# Patient Record
Sex: Female | Born: 1975 | Race: White | Hispanic: Yes | Smoking: Never smoker
Health system: Southern US, Community
[De-identification: ages and names within clinical notes are randomized; demographics above are authoritative.]

## PROBLEM LIST (undated history)

## (undated) DIAGNOSIS — G43909 Migraine, unspecified, not intractable, without status migrainosus: Secondary | ICD-10-CM

## (undated) DIAGNOSIS — H539 Unspecified visual disturbance: Secondary | ICD-10-CM

## (undated) DIAGNOSIS — T7840XA Allergy, unspecified, initial encounter: Secondary | ICD-10-CM

## (undated) DIAGNOSIS — M549 Dorsalgia, unspecified: Secondary | ICD-10-CM

## (undated) DIAGNOSIS — F32A Depression, unspecified: Secondary | ICD-10-CM

## (undated) HISTORY — PX: TUBAL LIGATION: SHX77

## (undated) HISTORY — DX: Depression, unspecified: F32.A

## (undated) HISTORY — DX: Allergy, unspecified, initial encounter: T78.40XA

## (undated) HISTORY — DX: Unspecified visual disturbance: H53.9

## (undated) HISTORY — DX: Migraine, unspecified, not intractable, without status migrainosus: G43.909

---

## 2003-08-06 ENCOUNTER — Encounter: Admission: RE | Admit: 2003-08-06 | Discharge: 2003-08-06 | Payer: Self-pay | Admitting: Obstetrics and Gynecology

## 2004-04-01 ENCOUNTER — Ambulatory Visit (HOSPITAL_COMMUNITY): Admission: RE | Admit: 2004-04-01 | Discharge: 2004-04-01 | Payer: Self-pay | Admitting: *Deleted

## 2004-04-05 ENCOUNTER — Ambulatory Visit: Payer: Self-pay | Admitting: *Deleted

## 2004-04-05 ENCOUNTER — Ambulatory Visit (HOSPITAL_COMMUNITY): Admission: RE | Admit: 2004-04-05 | Discharge: 2004-04-05 | Payer: Self-pay | Admitting: *Deleted

## 2004-06-03 ENCOUNTER — Ambulatory Visit (HOSPITAL_COMMUNITY): Admission: RE | Admit: 2004-06-03 | Discharge: 2004-06-03 | Payer: Self-pay | Admitting: *Deleted

## 2004-07-04 ENCOUNTER — Inpatient Hospital Stay (HOSPITAL_COMMUNITY): Admission: AD | Admit: 2004-07-04 | Discharge: 2004-07-04 | Payer: Self-pay | Admitting: *Deleted

## 2004-07-04 ENCOUNTER — Ambulatory Visit: Payer: Self-pay | Admitting: Obstetrics & Gynecology

## 2004-07-04 ENCOUNTER — Ambulatory Visit: Payer: Self-pay | Admitting: Family Medicine

## 2004-07-11 ENCOUNTER — Ambulatory Visit (HOSPITAL_COMMUNITY): Admission: RE | Admit: 2004-07-11 | Discharge: 2004-07-11 | Payer: Self-pay | Admitting: *Deleted

## 2004-08-19 ENCOUNTER — Inpatient Hospital Stay (HOSPITAL_COMMUNITY): Admission: RE | Admit: 2004-08-19 | Discharge: 2004-08-22 | Payer: Self-pay | Admitting: *Deleted

## 2004-08-19 ENCOUNTER — Ambulatory Visit: Payer: Self-pay | Admitting: *Deleted

## 2004-08-24 ENCOUNTER — Inpatient Hospital Stay (HOSPITAL_COMMUNITY): Admission: AD | Admit: 2004-08-24 | Discharge: 2004-08-24 | Payer: Self-pay | Admitting: Obstetrics and Gynecology

## 2006-05-11 ENCOUNTER — Inpatient Hospital Stay (HOSPITAL_COMMUNITY): Admission: AD | Admit: 2006-05-11 | Discharge: 2006-05-12 | Payer: Self-pay | Admitting: Gynecology

## 2007-01-07 LAB — CONVERTED CEMR LAB
Pap Smear: NORMAL
Pap Smear: NORMAL

## 2008-01-06 ENCOUNTER — Ambulatory Visit: Payer: Self-pay | Admitting: Family Medicine

## 2008-01-06 DIAGNOSIS — R51 Headache: Secondary | ICD-10-CM

## 2008-01-06 DIAGNOSIS — E663 Overweight: Secondary | ICD-10-CM | POA: Insufficient documentation

## 2008-01-06 DIAGNOSIS — F329 Major depressive disorder, single episode, unspecified: Secondary | ICD-10-CM | POA: Insufficient documentation

## 2008-01-06 DIAGNOSIS — R519 Headache, unspecified: Secondary | ICD-10-CM | POA: Insufficient documentation

## 2008-01-06 DIAGNOSIS — M549 Dorsalgia, unspecified: Secondary | ICD-10-CM | POA: Insufficient documentation

## 2008-01-06 LAB — CONVERTED CEMR LAB
Bilirubin Urine: NEGATIVE
Blood in Urine, dipstick: NEGATIVE
Chlamydia, DNA Probe: NEGATIVE
GC Probe Amp, Genital: NEGATIVE
Glucose, Urine, Semiquant: NEGATIVE
Ketones, urine, test strip: NEGATIVE
Nitrite: NEGATIVE
Protein, U semiquant: NEGATIVE
Specific Gravity, Urine: 1.015
Urobilinogen, UA: 0.2
WBC Urine, dipstick: NEGATIVE
pH: 7

## 2008-01-07 ENCOUNTER — Encounter: Payer: Self-pay | Admitting: Family Medicine

## 2008-02-17 ENCOUNTER — Emergency Department (HOSPITAL_COMMUNITY): Admission: EM | Admit: 2008-02-17 | Discharge: 2008-02-17 | Payer: Self-pay | Admitting: Family Medicine

## 2008-08-03 ENCOUNTER — Ambulatory Visit: Payer: Self-pay | Admitting: Family Medicine

## 2008-08-03 DIAGNOSIS — R002 Palpitations: Secondary | ICD-10-CM | POA: Insufficient documentation

## 2008-08-06 ENCOUNTER — Ambulatory Visit: Payer: Self-pay | Admitting: Family Medicine

## 2008-08-06 ENCOUNTER — Encounter: Payer: Self-pay | Admitting: Family Medicine

## 2008-08-06 LAB — CONVERTED CEMR LAB
ALT: 10 units/L (ref 0–35)
AST: 14 units/L (ref 0–37)
Albumin: 4.4 g/dL (ref 3.5–5.2)
Alkaline Phosphatase: 58 units/L (ref 39–117)
BUN: 14 mg/dL (ref 6–23)
Basophils Absolute: 0 10*3/uL (ref 0.0–0.1)
Basophils Relative: 1 % (ref 0–1)
CO2: 25 meq/L (ref 19–32)
Calcium: 8.8 mg/dL (ref 8.4–10.5)
Chloride: 104 meq/L (ref 96–112)
Cholesterol: 185 mg/dL (ref 0–200)
Creatinine, Ser: 0.57 mg/dL (ref 0.40–1.20)
Eosinophils Absolute: 0.1 10*3/uL (ref 0.0–0.7)
Eosinophils Relative: 2 % (ref 0–5)
Glucose, Bld: 89 mg/dL (ref 70–99)
HCT: 42.4 % (ref 36.0–46.0)
HDL: 54 mg/dL (ref >39)
Hemoglobin: 13.9 g/dL (ref 12.0–15.0)
LDL Cholesterol: 119 mg/dL — ABNORMAL HIGH (ref 0–99)
Lymphocytes Relative: 26 % (ref 12–46)
Lymphs Abs: 1.1 10*3/uL (ref 0.7–4.0)
MCHC: 32.8 g/dL (ref 30.0–36.0)
MCV: 88.7 fL (ref 78.0–100.0)
Monocytes Absolute: 0.2 10*3/uL (ref 0.1–1.0)
Monocytes Relative: 5 % (ref 3–12)
Neutro Abs: 2.8 10*3/uL (ref 1.7–7.7)
Neutrophils Relative %: 66 % (ref 43–77)
Platelets: 215 10*3/uL (ref 150–400)
Potassium: 4.4 meq/L (ref 3.5–5.3)
RBC: 4.78 M/uL (ref 3.87–5.11)
RDW: 12.9 % (ref 11.5–15.5)
Sodium: 140 meq/L (ref 135–145)
TSH: 1.401 microintl units/mL (ref 0.350–4.50)
Total Bilirubin: 0.4 mg/dL (ref 0.3–1.2)
Total CHOL/HDL Ratio: 3.4
Total Protein: 7.3 g/dL (ref 6.0–8.3)
Triglycerides: 59 mg/dL (ref <150)
VLDL: 12 mg/dL (ref 0–40)
WBC: 4.3 10*3/uL (ref 4.0–10.5)

## 2008-08-12 ENCOUNTER — Encounter: Payer: Self-pay | Admitting: Family Medicine

## 2008-10-16 ENCOUNTER — Telehealth: Payer: Self-pay | Admitting: Family Medicine

## 2008-10-16 ENCOUNTER — Encounter (INDEPENDENT_AMBULATORY_CARE_PROVIDER_SITE_OTHER): Payer: Self-pay | Admitting: Family Medicine

## 2008-10-16 ENCOUNTER — Ambulatory Visit: Payer: Self-pay | Admitting: Family Medicine

## 2008-10-16 LAB — CONVERTED CEMR LAB
ALT: 14 units/L (ref 0–35)
AST: 21 units/L (ref 0–37)
Albumin: 4.1 g/dL (ref 3.5–5.2)
Alkaline Phosphatase: 59 units/L (ref 39–117)
BUN: 11 mg/dL (ref 6–23)
Beta hcg, urine, semiquantitative: NEGATIVE
Bilirubin Urine: NEGATIVE
Blood in Urine, dipstick: NEGATIVE
CO2: 27 meq/L (ref 19–32)
Calcium: 9.1 mg/dL (ref 8.4–10.5)
Chlamydia, Swab/Urine, PCR: NEGATIVE
Chloride: 105 meq/L (ref 96–112)
Creatinine, Ser: 0.59 mg/dL (ref 0.40–1.20)
GC Probe Amp, Urine: NEGATIVE
Glucose, Bld: 82 mg/dL (ref 70–99)
Glucose, Urine, Semiquant: NEGATIVE
HCT: 39.3 % (ref 36.0–46.0)
Hemoglobin: 13.6 g/dL (ref 12.0–15.0)
Lipase: 32 units/L (ref 11–59)
MCHC: 34.7 g/dL (ref 30.0–36.0)
MCV: 85.2 fL (ref 78.0–100.0)
Nitrite: NEGATIVE
Platelets: 233 10*3/uL (ref 150–400)
Potassium: 4.2 meq/L (ref 3.5–5.3)
RBC: 4.61 M/uL (ref 3.87–5.11)
RDW: 13.4 % (ref 11.5–15.5)
Sodium: 139 meq/L (ref 135–145)
Specific Gravity, Urine: 1.025
Total Bilirubin: 0.9 mg/dL (ref 0.3–1.2)
Total Protein: 6.9 g/dL (ref 6.0–8.3)
Urobilinogen, UA: 0.2
WBC Urine, dipstick: NEGATIVE
WBC: 8.9 10*3/uL (ref 4.0–10.5)
pH: 6

## 2009-06-09 ENCOUNTER — Ambulatory Visit: Payer: Self-pay | Admitting: Family Medicine

## 2009-06-09 ENCOUNTER — Encounter: Payer: Self-pay | Admitting: Sports Medicine

## 2009-06-09 LAB — CONVERTED CEMR LAB
Bilirubin Urine: NEGATIVE
Glucose, Urine, Semiquant: NEGATIVE
Ketones, urine, test strip: NEGATIVE
Nitrite: NEGATIVE
Protein, U semiquant: 30
Specific Gravity, Urine: 1.025
Urobilinogen, UA: 0.2
pH: 6

## 2009-11-19 ENCOUNTER — Ambulatory Visit: Payer: Self-pay | Admitting: Family Medicine

## 2009-11-19 ENCOUNTER — Encounter: Payer: Self-pay | Admitting: Family Medicine

## 2010-08-02 NOTE — Assessment & Plan Note (Signed)
Summary: crying a lot/[place]/mayans   Vital Signs:  Patient profile:   34 year old female Weight:      152.9 pounds Temp:     98.7 degrees F oral Pulse rate:   78 / minute BP sitting:   118 / 78  (left arm) Cuff size:   regular  Vitals Entered By: Loralee Pacas CMA (Nov 19, 2009 3:22 PM) CC: depression   Primary Care Provider:  Ardeen Garland  MD  CC:  depression.  History of Present Illness: depression: for at least a year has noted: -she will cry very easily, she has felt down and at times will snap at others just so they will leave her alone. - sleep trouble - having trouble getting to sleep, having aches and pains in neck at times when trying to relax and rest - not doing things she is interested in - somedays she staes that she wants no outside contact or to do even anything when in the past she has been a very active and engaging person.  has also lost interest in having intercourse with her husband.  (though she told my nurse she was interested in more children, husband is not and she has had a tubal ligation 3 yrs ago which may have been the start of her depression).   she does note she feels safe in her home even though her husband is a drinker. - she has felt guilty about how much she hated her job and then quitting.  she is currently unemployed and uninsured - she has had very low energy - some days she doesn't even want to get out of the bed. - she has had trouble concentrating - she has had a poor appetite with weight loss.  -she denies psychomotor agitation and denies suicidial or homocidal ideation.  she does state she is alwyas looking for the bad in things and not the good for the past year and that she has had problems like this in the past.  additionally her sister has struggled some with depression.  translation provided by [employer] (spanish interpreter)  Habits & Providers  Alcohol-Tobacco-Diet     Tobacco Status: current     Tobacco Counseling: to quit  use of tobacco products     Cigarette Packs/Day: <0.25  Current Medications (verified): 1)  Citalopram Hydrobromide 40 Mg Tabs (Citalopram Hydrobromide) .... Take 1/2 Tablet By Mouth Daily For 1 Week Then 1 Tab By Mouth Daily - Spanish Instructions Please  Allergies (verified): 1)  ! Amoxicillin (Amoxicillin)  Family History: Family History Diabetes 1st degree relative - M, sister Family History Hypertension - M  Family History of Endometrial cancer? in [facility] Liver cancer MGF Family History of Skin cancer PGM   sister with depression  Social History: Married,, 4 children, 6 -18 years of age Native of [place], [place], in [place] 6 years Education: 7 years, can read Spanish well Husband Corporate investment banker, no health insurance  Faith: Catholic Alcohol use-yes, rare Drug use-no Husband does drink but Jaquisha state she feels safe in her home. Smoking Status:  current Packs/Day:  <0.25  Review of Systems       per HPI  Physical Exam  General:  Well-developed,well-nourished,in no acute distress; alert,appropriate and cooperative throughout examination.  VS noted.  depressed appearing.  poor eye contact   Impression & Recommendations:  Problem # 1:  DEPRESSION (ICD-311) Assessment Deteriorated  Penni is describing classic depression symptoms.  her situation is difficult given her lack of insurance  and very limited english. regardless spent approximately 25 minutes of face to face time counseling on depression and treatment and an additional 10 minutes attempting to coordinate resources for her in the community to seek counseling (see pt instructions) will start citalopram for now, f/u in 2 wks.  encouraged complaince (med is on $4 list).  encouraged counseling  see scaned in PHQ-9  Her updated medication list for this problem includes:    Citalopram Hydrobromide 40 Mg Tabs (Citalopram hydrobromide) .Marland Kitchen... Take 1/2 tablet by mouth daily for 1 week then 1 tab by  mouth daily - spanish instructions please  Orders: [facility]- Est Level  3 (60454)  Complete Medication List: 1)  Citalopram Hydrobromide 40 Mg Tabs (Citalopram hydrobromide) .... Take 1/2 tablet by mouth daily for 1 week then 1 tab by mouth daily - spanish instructions please  Patient Instructions: 1)  Please follow up with Dr Georgiana Shore or Dr Sandi Mealy in 2 weeks.  2)  Please start the medication for depression. 3)  Please call these places to ask about depression counseling: 4)  [facility] Department: [phone] 5)  [facility]: [phone] 6)  [facility] [place]: [phone] 7)  IF you develop feelings of hurting yourself, seek help immediately. Prescriptions: CITALOPRAM HYDROBROMIDE 40 MG TABS (CITALOPRAM HYDROBROMIDE) Take 1/2 tablet by mouth daily for 1 week then 1 tab by mouth daily - spanish instructions please  #30 x 3   Entered and Authorized by:   Ancil Boozer  MD   Signed by:   Ancil Boozer  MD on 11/19/2009   Method used:   Electronically to        [facility] [phone]* (retail)       [address]       [place], [place]  [address]       Ph: [phone]       Fax: [phone]   RxID:   [id number]

## 2010-08-02 NOTE — Miscellaneous (Signed)
Summary: crying  Clinical Lists Changes per interpretor she "feels down" . has been taking vitamins to help. no sex with spouse. crys alot. also c/o hemmorhoids. all this started about a month ago. denies SI or HI. will come in now. she will need more than one appt. she is aware of this.interpretor is present.Golden Circle RN  Nov 19, 2009 2:28 PM

## 2010-08-02 NOTE — Letter (Signed)
Summary: PHQ-9  PHQ-9   Imported By: De Nurse 12/01/2009 16:25:16  _____________________________________________________________________  External Attachment:    Type:   Image     Comment:   External Document

## 2010-08-02 NOTE — Assessment & Plan Note (Signed)
 Summary: female problem,tcb   Vital Signs:  Patient profile:   35 year old female Height:      64 inches Weight:      159 pounds BMI:     27.39 BSA:     1.78 Temp:     98.1 degrees F Pulse rate:   66 / minute BP sitting:   122 / 78  Vitals Entered By: Harlene Carte CMA (June 09, 2009 3:41 PM) CC: ?UTI Is Patient Diabetic? No Pain Assessment Patient in pain? yes     Location: urination   Primary Care Provider:  Leita Spires  MD  CC:  ?UTI.  History of Present Illness: DYSURIA Onset:   Description: Burning. Modifying factors: None  Symptoms Urgency:  Yes Frequency:  yes Hesitancy:  no Hematuria:  mild Flank Pain:  no Fever: no Nausea/Vomiting:  no Missed LMP: no STD exposure: no Discharge: no Irritants: no Rash: no  Red Flags   More than 3 UTI's last 12 months:  no PMH of Diabetes or Immunosuppression:  no Renal Disease/Calculi: no Urinary Tract Abnormality: no   Instrumentation or Trauma: no  Rash with Amox.    Habits & Providers  Alcohol-Tobacco-Diet     Tobacco Status: never  Current Medications (verified): 1)  Ibuprofen  200 Mg  Caps (Ibuprofen ) .... Take 3 Tabs Three Times A Day As Needed 2)  Citalopram Hydrobromide 40 Mg Tabs (Citalopram Hydrobromide) .... Sig: Take 1/2 Tablet By Mouth Daily For 8 Days, Then 1 Tab By Mouth Daily Disp #30 Label in Spanish 3)  Cephalexin 500 Mg Tabs (Cephalexin) .... One Tab By Mouth Two Times A Day X 5 Days.  Allergies (verified): 1)  ! Amoxicillin (Amoxicillin)  Past History:  Past Medical History: Last updated: 01/06/2008 Depression  Past Surgical History: Last updated: 01/06/2008 Caesarean section x 4 Tubal ligation  Family History: Last updated: 01/06/2008 Family History Diabetes 1st degree relative - M, sister Family History Hypertension - M  Family History of Endometrial cancer? in [facility] Liver cancer MGF Family History of Skin cancer PGM  Social History: Last updated:  08/03/2008 Married,, 4 children, 65 -13 years of age Native of [place], [place], in [place] 6 years Education: 7 years, can read Spanish well Husband corporate investment banker, no health insurance  Faith: Catholic Never Smoked Alcohol use-yes, rare Drug use-no  Review of Systems       See HPI  Physical Exam  General:  Well-developed,well-nourished,in no acute distress; alert,appropriate and cooperative throughout examination Abdomen:  Bowel sounds positive,abdomen soft and non-tender without masses, organomegaly or hernias noted. Genitalia:  No CVAT Skin:  Intact without suspicious lesions or rashes   Impression & Recommendations:  Problem # 1:  DYSURIA (ICD-788.1) Assessment New Likely uncomplicated cystitis.  abx x 5 days, awaiting UCx, RTC if no improvement in 5 days.  Her updated medication list for this problem includes:    Cephalexin 500 Mg Tabs (Cephalexin) ..... One tab by mouth two times a day x 5 days.  Orders: Urinalysis-[facility] (00000) [facility]- Est Level  3 (00786) Urine Culture-[facility] ([id number])  Complete Medication List: 1)  Ibuprofen  200 Mg Caps (Ibuprofen ) .... Take 3 tabs three times a day as needed 2)  Citalopram Hydrobromide 40 Mg Tabs (Citalopram hydrobromide) .... Sig: take 1/2 tablet by mouth daily for 8 days, then 1 tab by mouth daily disp #30 label in spanish 3)  Cephalexin 500 Mg Tabs (Cephalexin) .... One tab by mouth two times a day x 5 days.  Patient Instructions:  1)  Parece usted tiene una infeccin de UTI o de la vejiga. Esto se trata con un curso de 5 das de Keflex. Vuelto si sus sntomas haven' t mejor despus de acabar los antibiticos.  2)  -Dr. ONEIDA. 3)  It looks like you have a UTI or bladder infection.  This is treated with a 5 day course of Keflex.  Come back if your symptoms haven't improved after finishing the antibiotics. 4)  -Dr. ONEIDA. Prescriptions: CEPHALEXIN 500 MG TABS (CEPHALEXIN) One tab by mouth two times a day x 5 days.  #10 x 0    Entered and Authorized by:   Debby Petties MD   Signed by:   Debby Petties MD on 06/09/2009   Method used:   Print then Give to Patient   RxID:   [id number]   Laboratory Results   Urine Tests  Date/Time Received: June 09, 2009 3:46 PM  Date/Time Reported: June 09, 2009 4:10 PM   Routine Urinalysis   Color: yellow Appearance: Cloudy Glucose: negative   (Normal Range: Negative) Bilirubin: negative   (Normal Range: Negative) Ketone: negative   (Normal Range: Negative) Spec. Gravity: 1.025   (Normal Range: 1.003-1.035) Blood: moderate   (Normal Range: Negative) pH: 6.0   (Normal Range: 5.0-8.0) Protein: 30   (Normal Range: Negative) Urobilinogen: 0.2   (Normal Range: 0-1) Nitrite: negative   (Normal Range: Negative) Leukocyte Esterace: large   (Normal Range: Negative)  Urine Microscopic WBC/HPF: loaded RBC/HPF: 10-20 Bacteria/HPF: 2+ Epithelial/HPF: 1-5    Comments: urine cultured ...............test performed by......SABRABonnie A. Jordan, MLS (ASCP)cm      Appended Document: female problem,tcb Note, onset 1 week ago.

## 2010-11-18 NOTE — Op Note (Signed)
NAMECYANA, Dana Pope            ACCOUNT NO.:  [id number]   MEDICAL RECORD NO.:  [id number]          PATIENT TYPE:  INP   LOCATION:  9198                          [facility]   PHYSICIAN:  Conni Elliot, M.D.DATE OF BIRTH:  [birth year]   DATE OF PROCEDURE:  08/19/2004  DATE OF DISCHARGE:                                 OPERATIVE REPORT   PREOPERATIVE DIAGNOSIS:  Prior cesarean delivery x2.   POSTOPERATIVE DIAGNOSIS:  Prior cesarean delivery x2.   OPERATION:  Low transverse cesarean.   OPERATOR:  Conni Elliot, M.D.   ANESTHESIA:  Spinal.   OPERATIVE PROCEDURE:  After the patient was placed under a spinal  anesthetic, the patient was placed supine and prepped and draped in sterile  fashion, Foley catheter placed to straight drainage.  Then a low transverse  Pfannenstiel incision was made and the incision extended through the fascia,  the rectus muscles separated in the midline, to the peritoneum, the bladder  flap created.  A low transverse uterine incision was made and the baby  delivered from a vertex presentation, the cord doubly clamped and cut, the  baby handed to the neonatologist in attendance.  The placenta was delivered  spontaneously.  The uterus was closed in routine fashion.  There were two  adhesions of the bladder flap that had been brought too far anterior, that  the bladder flap was left open.  The anterior peritoneum, fascia,  subcutaneous tissue and skin were closed in standard fashion.  Estimated  blood loss less than 800 mL.  The instrument and sponge count was correct.      ASG/MEDQ  D:  08/19/2004  T:  08/19/2004  Job:  [id number]

## 2010-11-18 NOTE — Group Therapy Note (Signed)
Dana Pope, WOOLF NO.:  [id number]   MEDICAL RECORD NO.:  [id number]                   PATIENT TYPE:  OUT   LOCATION:  [facility]                           FACILITY[facility]  [facility]   PHYSICIAN:  Argentina Donovan, MD                     DATE OF BIRTH:  December 08, 1975   DATE OF SERVICE:  08/06/2003                                    CLINIC NOTE   CHIEF COMPLAINT:  Follow-up abnormal Pap smear and colposcopy.   SUBJECTIVE:  Dana Pope is a 35 year old Hispanic female who presented to  [facility] for a Pap smear on February 18, 2003.  The Pap smear came back  ASC-H.  She underwent colposcopy on October 1.  The results of colposcopy  were inconsistent with her Pap smear and only showed chronic cervicitis.  She has had no history of abnormal Paps in the past.  She presents today  very concerned and worried that she has cervical cancer.   OBSTETRICAL AND GYNECOLOGICAL HISTORY:  She is a G2 P2-0-0-2 status post two  low transverse C-sections.  Her LMP - she is on Depo-Provera and has not had  any recently.   OBJECTIVE:  VITAL SIGNS:  Temperature 99.3, heart rate 95, blood pressure  132/75.  GENERAL:  She is an alert Hispanic female in no acute distress.  Appears  well-nourished.  PELVIC:  Normal external female genitalia.  Urethra is normal.  Vaginal  walls and cervix appear normal without lesions or masses.  There is no  cervical discharge.   ASSESSMENT AND PLAN:  Abnormal Pap smear.  A second Pap smear was taken  today with HPV testing.  A long discussion was had with her about her  current diagnosis and treatment options.  She is to follow up in 2 weeks and  at that time if she does have high-risk HPV a LEEP should be scheduled.     Maurice March, MD                          Argentina Donovan, MD    LC/MEDQ  D:  08/06/2003  T:  08/06/2003  Job:  [id number]

## 2010-11-18 NOTE — Discharge Summary (Signed)
Dana Pope, Dana Pope            ACCOUNT NO.:  [id number]   MEDICAL RECORD NO.:  [id number]          PATIENT TYPE:  INP   LOCATION:  9101                          [facility]   PHYSICIAN:  Henri Medal, MDDATE OF BIRTH:  August 17, 1975   DATE OF ADMISSION:  08/19/2004  DATE OF DISCHARGE:  08/22/2004                                 DISCHARGE SUMMARY   ADMISSION DIAGNOSIS:  A 35 years old G3, P2-0-0-2 at 39 weeks and 1 day for  plan C-section procedure after cesarean delivery x2.   DISCHARGE DIAGNOSES:  51.  A 34 year old G3, P3, postoperative day #3 of RLTS for term pregnancy      with two previous cesarean deliveries.  2.  Viable female with Apgars of 9 at first minute and 9 at the fifth      minute.   DISCHARGE MEDICATIONS:  1.  Percocet 5/325 mg p.o. q.4 h. p.r.n. pain.  2.  Prenatal vitamins 1 tablet p.o. daily.  3.  Ortho-Micronor 1 tablet p.o. daily (start on August 28, 2004).  4.  MiraLax 17 g p.o. daily p.r.n. constipation.   ADMISSION HISTORY:  A 35 year old G3, P2-0-0-2 at 39 weeks and 1 day  presented to [facility] for __________ C-section.  This patient has  had a history of two previous cesarean deliveries.   HOSPITAL COURSE:  The patient tolerated the procedure well.  Throughout the  rest of hospitalization, physical examination and vital signs were stable.  The patient was breast feeding.  The patient's blood type is seropositive,  antibody negative.  Rubella immune.  GB8 negative.   CONDITION ON DISCHARGE:  Stable.   DISCHARGE INSTRUCTIONS:  Pelvic rest for six weeks.  The patient will call  to [facility] for a followup appointment at six weeks after C-section.  The patient will come back to [facility] on Wednesday for staple removal.  The  patient will start Micronor on August 28, 2004.  The patient had further  instructions and followup appointment.      FIM/MEDQ  D:  08/22/2004  T:  08/22/2004  Job:  [id number]

## 2010-12-21 ENCOUNTER — Ambulatory Visit (INDEPENDENT_AMBULATORY_CARE_PROVIDER_SITE_OTHER): Payer: Self-pay | Admitting: Family Medicine

## 2010-12-21 VITALS — BP 119/81 | HR 88 | Temp 97.9°F | Wt 154.0 lb

## 2010-12-21 DIAGNOSIS — H8110 Benign paroxysmal vertigo, unspecified ear: Secondary | ICD-10-CM | POA: Insufficient documentation

## 2010-12-21 HISTORY — DX: Benign paroxysmal vertigo, unspecified ear: H81.10

## 2010-12-21 MED ORDER — MECLIZINE HCL 12.5 MG PO TABS: 12.5000 mg | ORAL_TABLET | Freq: Three times a day (TID) | ORAL | Status: DC | PRN

## 2010-12-21 MED ORDER — PROMETHAZINE HCL 12.5 MG PO TABS: 12.5000 mg | ORAL_TABLET | Freq: Four times a day (QID) | ORAL | Status: AC | PRN

## 2010-12-21 NOTE — Progress Notes (Signed)
  Subjective:    Patient ID: Dana Pope, female    DOB: 18-Feb-1976, 35 y.o.   MRN: [id number]  HPI 1. Dizziness:  She has had dizziness for about 2 weeks.  It is staying about the same.  It is described like the "room is spinning".  It is worse when she turns her head rapidly from side to side.  She has never had something like this happen before.  It is associated with a headache and nausea.   Review of Systems Denies vision changes, numbness/weakness, chest pain, shortness of breath.  Denies fevers, chills, neck pain.  Denies rash    Objective:   Physical Exam  Vitals reviewed. Constitutional: She is oriented to person, place, and time. She appears well-nourished. No distress.  HENT:  Head: Normocephalic and atraumatic.  Right Ear: External ear normal.  Left Ear: External ear normal.  Nose: Nose normal.  Mouth/Throat: Oropharynx is clear and moist. No oropharyngeal exudate.       TM's normal bilaterally  Eyes: Conjunctivae and EOM are normal. Pupils are equal, round, and reactive to light.       Normal fundoscopic exam bilaterally  Neck: Normal range of motion. Neck supple.  Cardiovascular: Normal rate, regular rhythm and normal heart sounds.   Pulmonary/Chest: Effort normal and breath sounds normal. No respiratory distress. She has no wheezes.  Abdominal: Soft. Bowel sounds are normal. She exhibits no distension.  Musculoskeletal: She exhibits no edema.  Lymphadenopathy:    She has no cervical adenopathy.  Neurological: She is alert and oriented to person, place, and time. She has normal reflexes. She displays normal reflexes. No cranial nerve deficit. She exhibits normal muscle tone. Coordination normal.       Normal gait.  Negative Romberg. + Dix-Hallpike manuever  Skin: Skin is warm. No rash noted. She is not diaphoretic.  Psychiatric: She has a normal mood and affect.          Assessment & Plan:

## 2010-12-21 NOTE — Patient Instructions (Signed)
Vrtigo postural benigno (VPB) (Benign Positional Vertigo (BPV) El vrtigo es una sensacin de inestabilidad o de movimiento de lo que lo rodea. El vrtigo posicional benigno (VPB) es la causa ms comn de vrtigo. Que sea benigno significa que no tiene no lo produce nada grave. Es Physiological scientist alteracin en el sistema de equilibrio del odo [place]. Se trata de un problema molesto pero normalmente no es grave. Una infeccin viral o lesin en la cabeza son causas comunes, pero a menudo no se encuentra ninguna causa. Es ms comn a medida que [employer]. SNTOMAS Se produce un mareo repentino al mover la cabeza en distintas direcciones. Algunos de los trastornos que ocasiona son:  Prdida del equilibrio   Vmitos    Visin borrosa     Chartered certified accountant de vomitar     DIAGNSTICO El profesional podr realizar pruebas especializadas para identificar el problema. INSTRUCCIONES PARA EL CUIDADO DOMICILIARIO  Descanse y consuma una dieta bien balanceada.   Muvase lentamente y no realice movimientos bruscos del cuerpo o la cabeza.   No conduzca ni realice actividades que puedan lesionarlo o lesionar a otros.   Recustese y descanse. Tome precauciones para prevenir cadas.  SOLICITE ATENCIN MDICA DE INMEDIATO SI:  Comienza a sentir dolor de cabeza intenso o duradero.   Presenta vmitos repetidamente.   Presenta una prdida o cambio temporal en la visin.   Nota adormecimiento transitorio de un costado del cuerpo.   Presenta incapacidad temporal para hablar.   Presenta zonas de debilidad transitorias.   Siente debilidad o adormecimiento [employer], los brazos o las piernas.   Siente mareos o dificultad para caminar.   Problemas para articular las palabras o dificultad para tragar.  ASEGRESE QUE:    Comprende estas instrucciones.   Controlar su enfermedad.   Solicitar ayuda de inmediato si no mejora o empeora.  Document Released: 10/05/2008   [facility] Patient Information 2011  [place], [place].

## 2010-12-21 NOTE — Assessment & Plan Note (Signed)
Improved after Eppley maneuvers.  No signs of serious intracranial pathology.  Provided patient with some at home exercises.  Also provided Rx for Meclizine and Phenergan to help with symptoms.  Reviewed precautions for worsening of symptoms.

## 2011-03-23 ENCOUNTER — Encounter: Payer: Self-pay | Admitting: Family Medicine

## 2011-03-23 ENCOUNTER — Ambulatory Visit (INDEPENDENT_AMBULATORY_CARE_PROVIDER_SITE_OTHER): Payer: Self-pay | Admitting: Family Medicine

## 2011-03-23 VITALS — BP 117/80 | HR 82 | Temp 98.2°F | Ht 63.75 in | Wt 157.9 lb

## 2011-03-23 DIAGNOSIS — R51 Headache: Secondary | ICD-10-CM

## 2011-03-23 MED ORDER — IBUPROFEN 600 MG PO TABS: 600.0000 mg | ORAL_TABLET | Freq: Four times a day (QID) | ORAL | Status: AC | PRN

## 2011-03-23 MED ORDER — PROMETHAZINE HCL 25 MG/ML IJ SOLN
25.0000 mg | Freq: Once | INTRAMUSCULAR | Status: AC
  Administered 2011-03-23: 25 mg via INTRAMUSCULAR

## 2011-03-23 MED ORDER — KETOROLAC TROMETHAMINE 30 MG/ML IJ SOLN
60.0000 mg | Freq: Once | INTRAMUSCULAR | Status: AC
  Administered 2011-03-23: 60 mg via INTRAMUSCULAR

## 2011-03-23 NOTE — Patient Instructions (Signed)
You may have migraine headaches. Please pick up prescription and take as needed. Please schedule a follow up appointment with your primary care provider. If you experience one-sided weakness/numbness, headache that does not improve with Motrin, nausea/vomiting, or black out spells, please report to ED.  Cefalea migraosa (Migraine Headache) Una cefalea migraosa es un dolor de cabeza intenso y punzante en uno de los dos lados de la [place]. No siempre se conoce la causa exacta. Una migraa puede producirse cuando los nervios del cerebro se irritant y liberan qumicos que producen inflamacin dentro de los vasos sanguneos y Teaching laboratory technician. Muchas personas que padecen cefaleas tienen una historia familiar de migraas. Antes de sufrir una cefalea migraosa podr o no tener un aura. Un aura es un grupo de sntomas que predicen el comienzo de la cefalea. Esto puede incluir:  Cambios visuales como:   Flashes de Patent examiner.   Ver punto brillosos o lneas en zigzag.   Visin en tnel.   Sensacin de hormigueo.   Problemas para hablar.   Debilitamiento muscular  SNTOMAS DE LA CEFALEA MIGRAOSA Usted puede tener uno o ms de los siguientes sntomas:  Dolor en uno o ambos lados de la cabeza.   Dolor punzante o con pulsaciones.   Dolor que es lo suficientemente grave en intensidad como para impedir las actividades habituales.   Se agrava por cualquier actividad fsica habitual.   Nuseas (ganas de vomitar), vmitos o ambos.   Dolor ante la exposicin a luces brillantes o a ruidos fuertes o con Agricultural engineer.   Sensibilidad general a luces brillantes o a ruidos fuertes.  DISPARADORES DE LA CEFALEA MIGRAOSA Una cefalea migraosa puede desencadenarse por varias causas. Algunos ejemplos son:   Neal Dy alcohol.   El hbito de fumar.   El estrs.   Pueden estar relacionadas con el perodo menstrual.   Quesos estacionados.   Alimentos o bebidas que contienen nitratos, glutamato, aspartamo o  tiramina.   La falta de sueo.   Chocolate   Cafena.   Hambre   Medicamentos como nitroglicerina (se utiliza para tratar dolores en el pecho), pldoras anticonceptivas, estrgenos y algunos medicamentos para la presin sangunea.  DIAGNSTICO  Una cefalea migraosa a menudo se diagnostica segn:   Sntomas   Examen fsico   Tomografa computada de la cabeza para ver si el dolor no proviene de otra enfermedad.  INSTRUCCIONES PARA EL CUIDADO DOMICILIARIO  Hay medicamentos que pueden prevenir estas cefaleas si son recurrentes o llegan a serlo en el futuro. El profesional que lo asiste lo ayudar a Horticulturist, commercial o el programa de tratamiento que puede ser de [place] para usted.   Si tiene Research officer, trade union, ser til [employer] habitacin oscura y [place].   Le ser de Human resources officer un registro acerca de los dolores de [place]. Esto le ayudar a Art therapist tendencia y saber qu es lo que dispara los dolores.  SOLICITE ATENCIN MDICA DE INMEDIATO SI:   No obtiene alivio de los [employer] han prescripto o tiene Nurse, children's.   Confusin, cambios en la personalidad que no son habituales, temblores.   El dolor lo despierta por las noches.   Presenta un aumento en la frecuencia de las cefaleas.   Presenta rigidez en el cuello.   Sufre prdida de la visin.   Siente debilidad muscular.   Comienza a perder el equilibrio o tiene problemas para caminar.   Sufre mareos o se desmaya.  ASEGRESE QUE:   Comprende estas instrucciones.  Controlar su enfermedad.   Solicitar ayuda de inmediato si no mejora o empeora.  Document Released: 06/19/2005 Document Re-Released: 09/13/2009 [facility] Patient Information 2011 [place], [place].

## 2011-03-23 NOTE — Progress Notes (Signed)
  Subjective:    Patient ID: Dana Pope, female    DOB: 12/25/1975, 35 y.o.   MRN: [id number]  HPI  History provided by patient and Spanish interpreter.  Patient presents to clinic for headache that started 4 days ago.  Located forehead and radiates to occiput.  Pain is intermittent and resolves after taking OTC analgesics.  However, headache comes back.  Associated symptoms: numbness/tingling bilateral upper extremities, photophobia.  Denies any LOC, dizziness, weakness, nausea/vomiting.  Onset was gradual.  Current pain 7/10.  Denies a family history of migraines.  She has headaches about 3 times/month, but not always associated with photophobia, numbness.    Review of Systems  Per HPI    Objective:   Physical Exam  General: in no acute distress HEENT: EOMI. PERRLA. Heart: RRR, no murmur MSK: no edema Neuro: alert and cooperative.  No motor or sensory deficits. CN II-XII intact.      Assessment & Plan:

## 2011-03-23 NOTE — Assessment & Plan Note (Signed)
Likely secondary to migraine headache. Will give migraine cocktail - Toradol 60 mg x 1 dose and Phenergan 25 mg x 1 dose now. Will give Rx for Motrin 600 mg q 6 hr prn pain. Advised patient to establish PCP and schedule follow up appointment. Red flags reviewed. Strongly encouraged annual physicals with red team physician in the future.

## 2011-06-29 ENCOUNTER — Encounter: Payer: Self-pay | Admitting: Family Medicine

## 2013-10-11 ENCOUNTER — Emergency Department (HOSPITAL_COMMUNITY)
Admission: EM | Admit: 2013-10-11 | Discharge: 2013-10-11 | Disposition: A | Discharge status: Home / Self Care | Payer: No Typology Code available for payment source | Source: Home / Self Care | Attending: Family Medicine | Admitting: Family Medicine

## 2013-10-11 ENCOUNTER — Encounter (HOSPITAL_COMMUNITY): Payer: Self-pay | Admitting: Emergency Medicine

## 2013-10-11 DIAGNOSIS — M542 Cervicalgia: Secondary | ICD-10-CM

## 2013-10-11 MED ORDER — PREDNISONE 10 MG PO KIT: PACK | ORAL | Status: DC

## 2013-10-11 MED ORDER — CYCLOBENZAPRINE HCL 5 MG PO TABS: 5.0000 mg | ORAL_TABLET | Freq: Every evening | ORAL | Status: DC | PRN

## 2013-10-11 NOTE — Discharge Instructions (Signed)
Gracias por venir hoy.  ° °

## 2013-10-11 NOTE — ED Provider Notes (Signed)
Dana Pope is a 38 y.o. female who presents to Urgent Care today for neck pain. Patient is experienced 4 days of neck pain occurred without injury. The pain is located in the left side of the neck. The pain does not radiate. She denies any weakness or numbness. She has tried ibuprofen which has helped a bit. She feels well otherwise.   History reviewed. No pertinent past medical history. History  Substance Use Topics  . Smoking status: Never Smoker   . Smokeless tobacco: Not on file  . Alcohol Use: Yes     Comment: occasional   ROS as above Medications: No current facility-administered medications for this encounter.   Current Outpatient Prescriptions  Medication Sig Dispense Refill  . cyclobenzaprine (FLEXERIL) 5 MG tablet Take 1 tablet (5 mg total) by mouth at bedtime as needed for muscle spasms.  20 tablet  0  . PredniSONE 10 MG KIT 12 day dose pack po  1 kit  0    Exam:  BP 113/73  Pulse 69  Temp(Src) 98.3 F (36.8 C) (Oral)  Resp 18  SpO2 99%  LMP 09/27/2013 Gen: Well NAD Neck: Nontender to spinal midline.  Tender palpation left cervical paraspinal and trapezius Decreased range of motion to leftward rotation and left lateral flexion. Decreased range of motion to extension. Normal otherwise. Negative Spurling's test. Upper extremity strength is intact throughout. Reflexes and sensation are intact distally.  No results found for this or any previous visit (from the past 24 hour(s)). No results found.  Assessment and Plan: 38 y.o. female with left cervical neck pain due to myofascial dysfunction. Plan to treat with prednisone and Flexeril. Followup with primary care provider.  Discussed warning signs or symptoms. Please see discharge instructions. Patient expresses understanding.    Gregor Hams, MD 10/11/13 [id number]

## 2013-10-11 NOTE — ED Notes (Signed)
Onset 3 days ago after having a headache intermittent left neck left side of /shoulder/ left upper back and left upper arm   She denies recent injury     She does not have the headache at present

## 2013-10-24 ENCOUNTER — Ambulatory Visit (INDEPENDENT_AMBULATORY_CARE_PROVIDER_SITE_OTHER): Payer: No Typology Code available for payment source | Admitting: Family Medicine

## 2013-10-24 ENCOUNTER — Encounter: Payer: Self-pay | Admitting: Family Medicine

## 2013-10-24 VITALS — BP 124/76 | HR 87 | Temp 99.1°F | Ht 64.0 in | Wt 163.8 lb

## 2013-10-24 DIAGNOSIS — R51 Headache: Secondary | ICD-10-CM

## 2013-10-24 MED ORDER — NORTRIPTYLINE HCL 25 MG PO CAPS: 25.0000 mg | ORAL_CAPSULE | Freq: Every day | ORAL | Status: DC

## 2013-10-24 MED ORDER — SUMATRIPTAN SUCCINATE 100 MG PO TABS: ORAL_TABLET | ORAL | Status: DC

## 2013-10-24 NOTE — Patient Instructions (Signed)
Thank you for coming in,   I am prescribing nortriptyline.  - take 25 mg the first week.  - take 50 mg the second week  - take 75 mg the third week and then on after that.   I am also starting imetrex. Please take one pill at the onset of a headache. You may take another pill 2 hours after the first pill if you are still having a headache. Please DO NOT take more than 2 pills in 24 hours.   Pleas follow up with me in 1-2 weeks.    Please feel free to call with any questions or concerns at any time, at [phone]. --Dr. Cain SaupeSchmitz  Dolor de cabeza, preguntas frecuentes y sus respuestas (Headaches, Frequently Asked Questions) CEFALEAS MIGRAOSAS P: Qu es la migraa? Qu la ocasiona? Cmo puedo tratarla? R: En general, la migraa comienza como un dolor apagado. Luego progresa hacia un dolor, constante, punzante y como un latido. Sentir Scientist, physiologicaleste dolor en las sienes. Podr sentir Radiographer, therapeuticdolor en la parte anterior o posterior de la cabeza, o en uno o ambos lados. El dolor suele estar acompaado de una combinacin de:  Nuseas.  Vmitos.  Sensibilidad a la luz y los ruidos. Algunas personas (un 15%) experimentan un aura (ver abajo) antes de un ataque. La causa de la migraa se debe a reacciones qumicas del cerebro. El tratamiento para la migraa puede incluir medicamentos de [place] libre. Tambin puede incluir tcnicas de Peruautoayuda. Estas incluyen entrenamientos para la relajacin y biorretroalimentacin.  P: Qu es un aura? R: Alrededor del 15% de las personas con migraa tiene un "aura". Es una seal de sntomas neurolgicos que ocurren antes de un dolor de cabeza por migraas. Podr ver lneas onduladas o irregulares, puntos o luces parpadeantes. Podr experimentar visin de tnel o puntos ciegos en uno o ambos ojos. El aura puede incluir alucinaciones visuales o auditivas (algo que se imagina). Puede incluir trastornos en el olfato (como olores extraos), el tacto o el gusto. Entre otros sntomas se  incluyen:  Adormecimiento.  Sensacin de hormigueo.  Dificultad para recordar o Occupational hygienistdecir la palabra correcta. Estos episodios neurolgicos pueden durar hasta 60 minutos. Los sntomas desaparecern a medida que el dolor de cabeza comience. P:Qu es un disparador? R: Ciertos factores fsicos o Sports administratorambientales pueden llevar a "disparar" una migraa. Estos son:  Alimentos.  Cambios hormonales.  Clima.  Estrs. Es importante recordar que los disparadores son diferentes entre si. Para ayudar a prevenir ataques de migraas, necesitar descubrir cules son los Psychologist, prison and probation servicesdisparadores que le afectan. Lleve un diario sobre sus dolores de [place]abe[place]. Este es un buen modo para descubrir los disparadores. El Sales executivediario le ayudar en el momento de hablar con el profesional acerca de su enfermedad. P: El clima afecta en las migraas? R: La luz solar, el calor, la humedad y lo cambios drsticos en la presin Software engineerbaromtrica pueden llevar a, o "disparar" un ataque de migraa en Time Warneralgunas personas. Pero estudios han demostrado que el clima no acta como disparador para todas las personas con [place]migraa. P: Cul es la relacin entre la migraa y la hormonas? R: Las hormonas inician y regulan muchas de las funciones corporales. Las hormonas Bank of New York Companymantienen el balance en el cuerpo dentro de los constantes cambios de Bazineambiente. Algunas veces, el nivel de hormonas en el cuerpo se desbalancea. Por ejemplo, durante la menstruacin, el embarazo o la Oak Leafmenopausia. Pueden ser la causa de un ataque de migraa. De hecho, alrededor de tres cuartos de las mujeres con migraa informan que sus  ataques estn relacionados con el ciclo menstrual.  P: Aumenta el riesgo de sufrir un choque cardaco en las personas que padecen migraa? R: La probabilidad de que un ataque de migraa ocasione un ataque cardaco es muy remota. Esto no quiere [employer] Brands persona que sufre de migraa no pueda tener un ataque cardaco asociado con ella. En las personas menores de 40 aos, el  factor ms comn para un ataque es la [place]. Pero durante la vida de una persona, la ocurrencia de un dolor de cabeza por migraa est asociada con una reduccin en el riesgo de morir por un ataque cerebrovascular.  P: Cules son los medicamentos para la migraa? R: La medicacin precisa se [place] para tratar el dolor de cabeza una vez que ha comenzado. Son ejemplos, medicamentos de [place], desinflamatorios sin esteroides, ergotamnicos y triptanos.  P: Qu son los triptanos? R: Lo triptanos son Yaakov Guthrie clase de medicamentos abortivos. Son especficos para tratar este problema. Los triptanos son vasoconstrictores. Moderan algunas reacciones qumicas del cerebro. Los triptanos trabajan como receptores del cerebro. Ayudan a Warehouse manager de un neurotransmisor denominado serotonina. Se cree que las fluctuaciones en los [place] de serotonina son la causa principal de la migraa.  P: Son [employer] de venta libre para la migraa? R: Los medicamentos de [employer] pueden ser efectivos para Paramedic dolores leves a moderados y los sntomas asociados a la [place]. Pero deber consultar a un mdico antes de Charity fundraiser tratamiento para la migraa.  P: Cules son los medicamentos de prevencin de la migraa? R: Se suele denominar tratamiento "profilctico" a los medicamentos para la prevencin de la migraa. Se utilizan para reducir Print production planner, gravedad y duracin de los ataques de [place]. Son ejemplos de medicamentos de prevencin: antiepilpticos, antidepresivos, bloqueadores beta, bloqueadores de los canales de calcio y medicamentos antiinflamatorios sin esteroides. P:  Por qu se utilizan anticonvulsivantes para tratar la migraa? R: Durante los ltimos aos, ha habido un creciente inters en las drogas antiepilpticas para la prevencin de la migraa. A menudo se los conoce como "anticonvulsivantes". La epilepsia y la migraa suceden por reacciones similares en  el cerebro.  P:  Por qu se utilizan antidepresivos para tratar la migraa? R: Los antidepresivos tpicamente se [place] para tratar a las personas con depresin. Pueden reducir la frecuencia de la migraa a travs de la regulacin de los niveles qumicos, como la serotonina, en el cerebro.  P:  Por qu se utilizan terapias alternativas para tratar la migraa? R: El trmino "terapias alternativas" suelen utilizarse para describir los tratamientos que se considera que estn por fuera de Occupational hygienist la medicina occidental convencional. Son ejemplos de las terapias alternativas: la acupuntura, la acupresin y el yoga. Otra terapia alternativa comn es la terapia herbal. Se cree que algunas hierbas ayudan a [employer]ning [employer] de [place]. Siempre consulte con [employer] acerca de las terapias alternativas antes de [place]. Algunos productos herbales contienen arsnico y [place] toxinas. DOLORES DE CABEZA POR TENSIN P: Qu es un dolor de cabeza por tensin? Qu lo ocasiona? Cmo puedo tratarlo? R: Los dolores de cabeza por tensin ocurren al azar. A menudo son el resultado de estrs temporario, ansiedad, fatiga o ira. Los sntomas Environmental education officer en las sienes, una sensacin como de tener una banda alrededor de la cabeza (un dolor que "presiona"). Los sntomas pueden incluir una sensacin de [place], de presin y Engineer, materials de los msculos de la cabeza y el cuello. El dolor comienza en la frente,  sienes o en la parte posterior de la cabeza y el cuello. El tratamiento para los dolores de cabeza por tensin puede incluir medicamentos de [place]. Tambin puede incluir tcnicas de [place] con entrenamientos para la relajacin y biorretroalimentacin. CEFALEA EN RACIMOS P: Qu es una cefalea en racimos? Qu la ocasiona? Cmo puedo tratarla? R: La cefalea en racimos toma su nombre debido a que los ataques vienen en grupos. El dolor aparece con poco o ningn aviso. Normalmente ocurre de un lado de la  cabeza. Muchas veces el dolor viene acompaado de un lagrimeo u ojo rojo y goteo de la nariz del mismo lado que Chief Technology Officer. Se cree que la causa es una reaccin en las sustancias qumicas del cerebro. Se describe como el caso ms grave e intenso de cualquier tipo de dolor de cabeza. El tratamiento incluye medicamentos bajo receta y oxgeno. CEFALEA SINUSAL P: Qu es una cefalea sinusal? Qu la ocasiona? Cmo puedo tratarla? R: Cuando se inflama una cavidad en los huesos de la cara y el crneo (sinus) ocasiona un dolor localizado. Esta enfermedad generalmente es el resultado de una reaccin alrgica, un tumor o una infeccin. Si el dolor de cabeza est ocasionado por un bloqueo del sinus, como una infeccin, probablemente tendr [place]. Una imagen de rayos X confirmar el bloqueo del sinus. El tratamiento indicado por el mdico podr incluir antibiticos para la infeccin, y tambin antihistamnicos o descongestivos.  DOLOR DE CABEZA POR EFECTO "REBOTE" P: Qu es un dolor de cabeza por efecto "rebote"? Qu lo ocasiona? Cmo puedo tratarlo? R: Si se toman medicamentos para el dolor de cabeza muy a menudo puede llevar a la enfermedad conocida como "dolor de cabeza por rebote". Un patrn de abuso de medicamentos para el dolor de cabeza supone tomarlos ms de [employer] por semana o en cantidades excesivas. Esto significa ms que lo que indica el envase o el mdico. Con los dolores de cabeza por rebote, los medicamentos no slo dejan de Engineer, materials sino que adems comienzan a Data processing manager dolores de [place]. Los mdicos tratan los dolores de cabeza por rebote mediante la disminucin del medicamento del que se ha abusado. A veces el medico podr sustituir gradualmente por un tipo diferente de tratamiento o medicacin. Dejar de consumirlo podra ser difcil. El abuso regular de un medicamento aumenta el potencial que se produzcan efectos secundarios graves. Consulte con un mdico si utiliza regularmente  medicamentos para Chief Technology Officer de cabeza ms de [address] por semana o ms de lo que indica el envase. PREGUNTAS Y RESPUESTAS ADICIONALES P: Qu es la biorretroalimentacin? R: La biorretroalimentacin es un tratamiento de [place]. La biorretroalimentacin utiliza un equipamiento especial para controlar los movimientos involuntarios del cuerpo y las respuestas fsicas. La biorretroalimentacin controla:  Respiracin.  Pulso.  Latidos cardacos.  Temperatura.  Tensin muscular.  Actividad cerebrales. La biorretroalimentacin le ayudar a mejorar y Production manager sus ejercicios de Microbiologist. Aprender a Chief Operating Officer las respuestas fsicas relacionadas con el estrs. Una vez que se dominan las tcnicas no necesitar ms el equipamiento. P: Son hereditarios los dolores de [place]? R: Segn algunas estimaciones, aproximadamente 28 millones de estadounidenses sufren migraa. Cuatro de cada cinco (80%) informan una historia familiar de migraa. Los investigadores no pueden asegurar si se trata de un problema gentico o Patent examiner. A pesar de esto, un nio tiene 50% de probabilidades de sufrir migraa si uno de sus padres la sufre. El nio tiene un 75% de probabilidades si ambos padres la sufren.  P.  Puede un nio tener migraa? R: En el momento de ingresar a la escuela secundaria, la mayora de los jvenes han experimentado algn tipo de cefalea. Algunos abordajes o medicamentos seguros y Sports administratorefectivos pueden evitar las cefaleas o detenerlas luego de que han comenzado.  Olin HauserP. Qu tipo de especialista debe ver para diagnosticar y tratar una cefalea? R: Comience con su mdico de cabecera. Huel Coteonverse acerca de su experiencia y abordaje de las cefaleas. Comente los mtodos de clasificacin, diagnstico y Marriott-Slatervilletratamiento. El profesional decidir si lo derivar a Music therapistun especialista, segn los sntomas u otras enfermedades. El hecho de sufrir diabetes, Environmental consultantalergias, Catering manageretc, puede requerir un abordaje ms complejo. La  National Headache Foundation (Fundacin Nacional para las Wendellefaleas) Chief Technology Officerproporcionar, a pedido, Physiological scientistuna lista de los mdicos que son miembros de [place]u estado. Document Released: 06/01/2008 Document Revised: 09/11/2011 [facility] Patient Information 2014 [place]Care, [place].

## 2013-10-26 ENCOUNTER — Encounter: Payer: Self-pay | Admitting: Family Medicine

## 2013-10-26 NOTE — Progress Notes (Signed)
   Subjective:    Patient ID: Dana Pope, female    DOB: June 26, 1976, 38 y.o.   MRN: [id number]  HPI  Jayni Bary Castilla is here for f/u to an Urgent care visit and this is first time meeting new physician.  Entire interview conducted with a spanish interpretor.   Her most significant complaint is having headaches. She has had them since she was a child. They occur in a global distribution and are accompanied by an aura. She has photosensitivity and are pulsing in nature. She is not able to do anything when the headaches occur. She have nausea but no emesis. She does report having anxiety associated with her family's problems back in [place]. She take 5 200 mg ibuprofen when she has a headache but this doesn't help anymore. The headache can last more than a couple of days.  They can range up to a 10/10. She took vitamin b previously and that did help. There is no family history of migraines and she has never had any head trauma. There are also not associated with her menstrual cycle.   She was recently seen in the ugrent care for neck pain and diagnosed with myofascial dysfunction. She was prescribed flexeril.     Current Outpatient Prescriptions on File Prior to Visit  Medication Sig Dispense Refill  . cyclobenzaprine (FLEXERIL) 5 MG tablet Take 1 tablet (5 mg total) by mouth at bedtime as needed for muscle spasms.  20 tablet  0  . PredniSONE 10 MG KIT 12 day dose pack po  1 kit  0   No current facility-administered medications on file prior to visit.    Review of Systems See HPI    Objective:   Physical Exam BP 124/76  Pulse 87  Temp(Src) 99.1 F (37.3 C) (Oral)  Ht $R'5\' 4"'cK$  (1.626 m)  Wt 163 lb 12.8 oz (74.299 kg)  BMI 28.10 kg/m2  LMP 10/15/2013 Gen: NAD, alert, cooperative with exam, well-appearing, female HEENT: NCAT, PERRL, clear conjunctiva, supple neck, no LAD, no papilledema  CV: RRR, good S1/S2, no murmur, no edema, capillary refill brisk  Resp: CTABL, no  wheezes, non-labored  MSK: 5/5 strength in upper and lower extremities b/l  Neuro: CN 2-12 intact, +2 knee reflexes, sensation intact in b/l lower and upper extremities  Psych: anxious during questioning       Assessment & Plan:

## 2013-10-26 NOTE — Assessment & Plan Note (Signed)
Some symptoms suggestive of migraine (pulsatile in nature, aura, photosensitive and disabling), tension (length of HA lasting >48 hours, neck pain, global distribution) and medication overuse (ibuprofen 200 mg x 5 BID or TID when she is having a headache for a week).  No neurological deficits. Also has an anxiety component as she alluded to many stressors in her life regarding family in [place].  - HA associated with aura so avoid Birth control  - stop using NSAIDS - Nortriptyline 25 mg QHS for 1 week, 50 mg QHS for 1 week and then 75 mg QHS thereafter   - discussed that it will take time before she starts feeling effects of medication - imetrex 100 mg   - used at the first sign of headache   - warned not to take more than 2 pills in 24 hours   - may take another pill if 2 hours after the first pill was taken   - no evidence of CAD (no HTN or DM)   - Discussed with Dr. McDiarmid  - f/u in 1-2 weeks; need GAD-7 and PHQ-9 if not documented

## 2014-01-14 ENCOUNTER — Encounter: Payer: No Typology Code available for payment source | Admitting: Family Medicine

## 2014-02-11 ENCOUNTER — Ambulatory Visit: Payer: Self-pay | Admitting: Gynecology

## 2014-03-05 ENCOUNTER — Encounter: Payer: Self-pay | Admitting: Gynecology

## 2014-03-05 ENCOUNTER — Other Ambulatory Visit (HOSPITAL_COMMUNITY)
Admission: RE | Admit: 2014-03-05 | Discharge: 2014-03-05 | Disposition: A | Discharge status: Home / Self Care | Payer: BC Managed Care – PPO | Source: Ambulatory Visit | Attending: Gynecology | Admitting: Gynecology

## 2014-03-05 ENCOUNTER — Ambulatory Visit (INDEPENDENT_AMBULATORY_CARE_PROVIDER_SITE_OTHER): Payer: BC Managed Care – PPO | Admitting: Gynecology

## 2014-03-05 VITALS — BP 126/78 | Ht 63.5 in | Wt 168.0 lb

## 2014-03-05 DIAGNOSIS — Z1151 Encounter for screening for human papillomavirus (HPV): Secondary | ICD-10-CM | POA: Insufficient documentation

## 2014-03-05 DIAGNOSIS — N949 Unspecified condition associated with female genital organs and menstrual cycle: Secondary | ICD-10-CM

## 2014-03-05 DIAGNOSIS — R6882 Decreased libido: Secondary | ICD-10-CM

## 2014-03-05 DIAGNOSIS — Z01419 Encounter for gynecological examination (general) (routine) without abnormal findings: Secondary | ICD-10-CM | POA: Diagnosis not present

## 2014-03-05 DIAGNOSIS — N938 Other specified abnormal uterine and vaginal bleeding: Secondary | ICD-10-CM

## 2014-03-05 DIAGNOSIS — Z113 Encounter for screening for infections with a predominantly sexual mode of transmission: Secondary | ICD-10-CM

## 2014-03-05 DIAGNOSIS — K5900 Constipation, unspecified: Secondary | ICD-10-CM

## 2014-03-05 MED ORDER — LINACLOTIDE 145 MCG PO CAPS: 145.0000 ug | ORAL_CAPSULE | Freq: Every day | ORAL | Status: DC

## 2014-03-05 NOTE — Progress Notes (Signed)
Dana Pope [birth year] [id number]   History:    38 y.o.  for annual gyn exam who is a new patient to the practice. Patient states that she has not had a gynecological exam in over 8 years. She is a gravida 4 para 4 with 4 previous cesarean sections the last one resulting in tubal sterilization. Patient with several complaints today as follows  #1 patient for several months has had more than one period  #2 decreased libido #3 constipation  #4 new sexual partner requesting STD screen  Patient stated that approximately 10 years ago she had an abnormal Pap smear and had colposcopy and biopsy but no treatment only subsequent Pap smear.  Past medical history,surgical history, family history and social history were all reviewed and documented in the EPIC chart.  Gynecologic History Patient's last menstrual period was 02/19/2014. Contraception: tubal ligation Last Pap: Over 8 years ago. Results were: Patient reports that it was normal Last mammogram: Not indicated. Results were: Not indicated  Obstetric History OB History  Gravida Para Term Preterm AB SAB TAB Ectopic Multiple Living  # Outcome Date GA Lbr Len/2nd Weight Sex Delivery Anes PTL Lv  4 PAR           3 PAR           2 PAR           1 PAR                ROS: A ROS was performed and pertinent positives and negatives are included in the history.  GENERAL: No fevers or chills. HEENT: No change in vision, no earache, sore throat or sinus congestion. NECK: No pain or stiffness. CARDIOVASCULAR: No chest pain or pressure. No palpitations. PULMONARY: No shortness of breath, cough or wheeze. GASTROINTESTINAL: No abdominal pain, nausea, vomiting or diarrhea, melena or bright red blood per rectum. GENITOURINARY: No urinary frequency, urgency, hesitancy or dysuria. MUSCULOSKELETAL: No joint or muscle pain, no back pain, no recent trauma. DERMATOLOGIC: No rash, no itching, no lesions. ENDOCRINE: No polyuria,  polydipsia, no heat or cold intolerance. No recent change in weight. HEMATOLOGICAL: No anemia or easy bruising or bleeding. NEUROLOGIC: No headache, seizures, numbness, tingling or weakness. PSYCHIATRIC: No depression, no loss of interest in normal activity or change in sleep pattern.     Exam: chaperone present  BP 126/78  Ht 5' 3.5" (1.613 m)  Wt 168 lb (76.204 kg)  BMI 29.29 kg/m2  LMP 02/19/2014  Body mass index is 29.29 kg/(m^2).  General appearance : Well developed well nourished female. No acute distress HEENT: Neck supple, trachea midline, no carotid bruits, no thyroidmegaly Lungs: Clear to auscultation, no rhonchi or wheezes, or rib retractions  Heart: Regular rate and rhythm, no murmurs or gallops Breast:Examined in sitting and supine position were symmetrical in appearance, no palpable masses or tenderness,  no skin retraction, no nipple inversion, no nipple discharge, no skin discoloration, no axillary or supraclavicular lymphadenopathy Abdomen: no palpable masses or tenderness, no rebound or guarding Extremities: no edema or skin discoloration or tenderness  Pelvic:  Bartholin, Urethra, Skene Glands: Within normal limits             Vagina: No gross lesions or discharge  Cervix: No gross lesions or discharge  Uterus  anteverted, normal size, shape and consistency, non-tender and mobile  Adnexa  Without masses or tenderness  Anus and perineum  normal  Rectovaginal  normal sphincter tone without palpated masses or tenderness             Hemoccult not indicated     Assessment/Plan:  38 y.o. female for annual exam with metromenorrhagia will be scheduled to return back to the office for a sonohysterogram. We will also consider doing an endometrial biopsy as well. I provided patient will return information on the Mirena IUD as well as on Her Option endometrial ablation. Pap smear was done today. A GC and chlamydia culture was obtained today. The following labs her were: CBC,  comprehensive metabolic panel, TSH, screening cholesterol, testosterone level and urinalysis. We discussed importance of monthly self breast exams. For her long-standing history of chronic constipation she is going to be prescribed Linzess 145 MCG capsule daily 30 minutes before breakfast.  Note: This dictation was prepared with  Dragon/digital dictation along withSmart phrase technology. Any transcriptional errors that result from this process are unintentional.   Ok Edwards MD, 4:50 PM 03/05/2014

## 2014-03-05 NOTE — Patient Instructions (Signed)
Linaclotide oral capsules Qu es este medicamento? La LINACLOTIDA se utiliza tambin para tratar el sndrome del intestino irritable con estreimiento como el problema principal. Se puede utilizar tambin para Theatre stage manager estreimiento crnico. Este medicamento puede ser utilizado para otros usos; si tiene alguna pregunta consulte con su proveedor de atencin mdica o con su farmacutico. MARCAS COMERCIALES DISPONIBLES: Linzess Qu le debo informar a mi profesional de la salud antes de tomar este medicamento? Necesita saber si usted presenta alguno de los siguientes problemas o situaciones: -antecedentes de impactacin de heces (fecal) -si tiene diarrea o si tiene diarrea con frecuencia -otra condicin mdica -enfermedad estomacal o intestinal incluyendo obstruccin intestinal o adhesiones abdominales -una reaccin alrgica o inusual a la linaclotida, a otros medicamentos, alimentos, colorantes o conservantes -si est embarazada o buscando quedar embarazada -si est amamantando a un beb Cmo debo utilizar este medicamento? Tome este medicamento por va oral con un vaso de agua. Siga las instrucciones de la etiqueta del [place]. No corte, triture ni [employer]. Tome este medicamento con el estmago vaco, por lo menos 30 minutos antes de la primera comida del Training and development officer. Tome sus dosis a intervalos regulares. No tome su medicamento con una frecuencia mayor a la indicada. No deje de tomarlo excepto si as lo indica su mdico. Su farmacutico le dar una [place] del medicamento especial con cada receta y relleno. Asegrese de leer esta informacin cada vez cuidadosamente. Hable con su pediatra acerca del uso de este medicamento en nios. Este medicamento no est aprobado para uso en nios. Sobredosis: Pngase en contacto inmediatamente con un centro toxicolgico o una sala de urgencia si usted cree que haya tomado demasiado medicamento. ATENCIN: [employer] es solo para usted. No  comparta este medicamento con nadie. Qu sucede si me olvido de una dosis? Si se olvida una dosis, omita esa dosis. Espere hasta su prxima dosis, tome slo esa dosis. No tome dosis adicionales o dobles. Qu puede interactuar con este medicamento? -ciertos medicamentos para problemas intestinales o incontinencia vesical (estos pueden causar estreimiento) Puede ser que esta lista no menciona todas las posibles interacciones. Informe a su profesional de [employer]	[place] de [employer] productos a base de hierbas, medicamentos de [place] o suplementos nutritivos que est tomando. Si usted fuma, consume bebidas alcohlicas o si utiliza drogas ilegales, indqueselo tambin a su profesional de [employer] Home	[place]. Algunas sustancias pueden interactuar con su medicamento. A qu debo estar atento al usar [employer]? Visite a su mdico para chequeos peridicos. Si los sntomas no mejoran o si empeoran, informe a su mdico. La diarrea es un efecto secundario comn de [place]. A menudo comienza dentro de 2 semanas de [employer]. Si tiene diarrea severa, deje de tomar [employer] y comunquese con su mdico. Deje de tomar este medicamento y comunquese con su mdico o vaya a la sala de emergencia del hospital ms cercano de inmediato si desarrolla dolor inusual o grave del rea del Paramedic (abdominal), especialmente si tiene tambin heces de color rojo fuerte, con sangre o heces de color oscuro que parecen alquitrn. Qu efectos secundarios puedo tener al [employer] este medicamento? Efectos secundarios que debe informar a su mdico o a Barrister's clerk de la salud tan pronto como sea posible: -Chief of Staff como erupcin cutnea, picazn o urticarias, hinchazn de la cara, labios o lengua -heces de color oscuro o de aspecto alquitranado -diarrea con sangre o diarrea acuosa -dolor estomacal nuevo o que empeora -diarrea grave o prolongada Efectos secundarios que, por  lo general, no  requieren atencin mdica (debe informarlos a su mdico o a su profesional de la salud si persisten o si son molestos): -hinchazn -gas -heces sueltas Puede ser que esta lista no menciona todos los posibles efectos secundarios. Comunquese a su mdico por asesoramiento mdico [employer]. Usted puede informar los efectos secundarios a la FDA por telfono al 1-800-FDA-1088. Dnde debo guardar mi medicina? Mantngala fuera del alcance de los nios. Gurdela a [employer], entre 20 y 75 grados C (66 y 63 grados F). Mantenga este medicamento en su envase original. Mantngalo bien cerrado en un lugar seco. No retire el paquete de desecante de la botella, le ayuda a proteger su medicamento contra la humedad. Deseche todo el medicamento que no haya utilizado, despus de la fecha de vencimiento. ATENCIN: Este folleto es un resumen. Puede ser que no cubra toda la posible informacin. Si usted tiene preguntas acerca de esta medicina, consulte con su mdico, su farmacutico o su profesional de Technical sales engineer.  2015, Elsevier/Gold Standard. (2011-03-23 16:45:56) Estreimiento (Constipation) Estreimiento significa que una persona tiene menos de tres evacuaciones en una semana, dificultad para defecar, o que las heces son secas, duras, o ms grandes que lo normal. A medida que envejecemos el estreimiento es ms comn. Si intenta curar el estreimiento con medicamentos que producen la evacuacin de las heces (laxantes), el problema puede empeorar. El uso prolongado de laxantes puede hacer que los msculos del colon se debiliten. Una dieta baja en fibra, no tomar suficientes lquidos y el uso de ciertos medicamentos pueden Agricultural engineer.  CAUSAS   Ciertos medicamentos, como los antidepresivos, analgsicos, suplementos de hierro, anticidos y diurticos.  Algunas enfermedades, como la diabetes, el sndrome del colon irritable, enfermedad de la tiroides, o depresin.  No beber  suficiente agua.  No consumir suficientes alimentos ricos en fibra.  Situaciones de estrs o viajes.  Falta de actividad fsica o de ejercicio.  Ignorar la necesidad sbita de Landscape architect.  Uso en exceso de laxantes. SIGNOS Y SNTOMAS   Defecar menos de tres veces por semana.  Dificultad para defecar.  Tener las heces secas y duras, o ms grandes que las normales.  Sensacin de estar lleno o hinchado.  Dolor en la parte baja del abdomen.  No sentir alivio despus de defecar. DIAGNSTICO  El mdico le har una historia clnica y un examen fsico. Pueden hacerle exmenes adicionales para el estreimiento grave. Estos estudios pueden ser:  Un radiografa con enema de bario para examinar el recto, el colon y, en algunos casos, el intestino delgado.  Una sigmoidoscopia para examinar el colon inferior.  Una colonoscopia para examinar todo el colon. TRATAMIENTO  El tratamiento depender de la gravedad del estreimiento y de la causa. Algunos tratamientos nutricionales son beber ms lquidos y comer ms alimentos ricos en fibra. El cambio en el estilo de vida incluye hacer ejercicios de [place] regular. Si estas recomendaciones para Animator dieta y en el estilo de vida no ayudan, el mdico le puede indicar el uso de laxantes de venta libre para ayudarlo a Landscape architect. Los medicamentos recetados se pueden prescribir si los medicamentos de venta libre no lo Quesada.  INSTRUCCIONES PARA EL CUIDADO EN EL HOGAR   Consuma alimentos con alto contenido de [place], como frutas, vegetales, cereales integrales y porotos.  Limite los alimentos procesados ricos en grasas y azcar, como las papas fritas, hamburguesas, galletas, dulces y refrescos.  Puede agregar un suplemento de fibra a su dieta si  no obtiene lo suficiente de los alimentos.  Beba suficiente lquido para Photographer orina clara o de color amarillo plido.  Haga ejercicio regularmente o segn las indicaciones del  mdico.  Vaya al bao cuando sienta la necesidad de ir. No se aguante las ganas.  Tome solo medicamentos de venta libre o recetados, segn las indicaciones del mdico. No tome otros medicamentos para el estreimiento sin consultarlo antes con su mdico. SOLICITE ATENCIN MDICA DE INMEDIATO SI:   Observa sangre brillante en las heces.  El estreimiento dura ms de 4 das o [place].  Siente dolor abdominal o rectal.  Las heces son delgadas como un lpiz.  Pierde peso de [place] inexplicable. ASEGRESE DE QUE:   Comprende estas instrucciones.  Controlar su afeccin.  Recibir ayuda de inmediato si no mejora o si empeora. Document Released: 07/09/2007 Document Revised: 06/24/2013 [facility] Patient Information 2015 [place], [place]. This information is not intended to replace advice given to you by your health care provider. Make sure you discuss any questions you have with your health care provider.

## 2014-03-06 ENCOUNTER — Other Ambulatory Visit: Payer: Self-pay | Admitting: Gynecology

## 2014-03-06 DIAGNOSIS — N938 Other specified abnormal uterine and vaginal bleeding: Secondary | ICD-10-CM

## 2014-03-06 LAB — CBC WITH DIFFERENTIAL/PLATELET
BASOS PCT: 1 % (ref 0–1)
Basophils Absolute: 0.1 10*3/uL (ref 0.0–0.1)
EOS ABS: 0.5 10*3/uL (ref 0.0–0.7)
Eosinophils Relative: 9 % — ABNORMAL HIGH (ref 0–5)
HCT: 38.5 % (ref 36.0–46.0)
Hemoglobin: 12.9 g/dL (ref 12.0–15.0)
Lymphocytes Relative: 20 % (ref 12–46)
Lymphs Abs: 1 10*3/uL (ref 0.7–4.0)
MCH: 29.7 pg (ref 26.0–34.0)
MCHC: 33.5 g/dL (ref 30.0–36.0)
MCV: 88.5 fL (ref 78.0–100.0)
Monocytes Absolute: 0.5 10*3/uL (ref 0.1–1.0)
Monocytes Relative: 9 % (ref 3–12)
Neutro Abs: 3.1 10*3/uL (ref 1.7–7.7)
Neutrophils Relative %: 61 % (ref 43–77)
Platelets: 259 10*3/uL (ref 150–400)
RBC: 4.35 MIL/uL (ref 3.87–5.11)
RDW: 12.7 % (ref 11.5–15.5)
WBC: 5.1 10*3/uL (ref 4.0–10.5)

## 2014-03-06 LAB — COMPREHENSIVE METABOLIC PANEL
ALK PHOS: 57 U/L (ref 39–117)
ALT: 12 U/L (ref 0–35)
AST: 20 U/L (ref 0–37)
Albumin: 4.3 g/dL (ref 3.5–5.2)
BUN: 13 mg/dL (ref 6–23)
CO2: 25 meq/L (ref 19–32)
Calcium: 9.5 mg/dL (ref 8.4–10.5)
Chloride: 101 mEq/L (ref 96–112)
Creat: 0.66 mg/dL (ref 0.50–1.10)
Glucose, Bld: 84 mg/dL (ref 70–99)
Potassium: 4.4 mEq/L (ref 3.5–5.3)
Sodium: 138 mEq/L (ref 135–145)
Total Bilirubin: 0.6 mg/dL (ref 0.2–1.2)
Total Protein: 7.1 g/dL (ref 6.0–8.3)

## 2014-03-06 LAB — URINALYSIS W MICROSCOPIC + REFLEX CULTURE
BACTERIA UA: NONE SEEN
BILIRUBIN URINE: NEGATIVE
CASTS: NONE SEEN
CRYSTALS: NONE SEEN
Glucose, UA: NEGATIVE mg/dL
Hgb urine dipstick: NEGATIVE
Ketones, ur: NEGATIVE mg/dL
Leukocytes, UA: NEGATIVE
Nitrite: NEGATIVE
Protein, ur: NEGATIVE mg/dL
SPECIFIC GRAVITY, URINE: 1.018 (ref 1.005–1.030)
Urobilinogen, UA: 0.2 mg/dL (ref 0.0–1.0)
pH: 6.5 (ref 5.0–8.0)

## 2014-03-06 LAB — TSH: TSH: 1.004 u[IU]/mL (ref 0.350–4.500)

## 2014-03-06 LAB — GC/CHLAMYDIA PROBE AMP
CT Probe RNA: NEGATIVE
GC Probe RNA: NEGATIVE

## 2014-03-06 LAB — CHOLESTEROL, TOTAL: Cholesterol: 183 mg/dL (ref 0–200)

## 2014-03-06 LAB — TESTOSTERONE: Testosterone: 44 ng/dL (ref 10–70)

## 2014-03-06 LAB — HIV ANTIBODY (ROUTINE TESTING W REFLEX): HIV 1&2 Ab, 4th Generation: NONREACTIVE

## 2014-03-11 LAB — CYTOLOGY - PAP

## 2014-03-26 ENCOUNTER — Ambulatory Visit (INDEPENDENT_AMBULATORY_CARE_PROVIDER_SITE_OTHER): Payer: BC Managed Care – PPO

## 2014-03-26 ENCOUNTER — Ambulatory Visit (INDEPENDENT_AMBULATORY_CARE_PROVIDER_SITE_OTHER): Payer: BC Managed Care – PPO | Admitting: Gynecology

## 2014-03-26 DIAGNOSIS — N920 Excessive and frequent menstruation with regular cycle: Secondary | ICD-10-CM

## 2014-03-26 DIAGNOSIS — N938 Other specified abnormal uterine and vaginal bleeding: Secondary | ICD-10-CM

## 2014-03-26 DIAGNOSIS — N925 Other specified irregular menstruation: Secondary | ICD-10-CM

## 2014-03-26 DIAGNOSIS — N949 Unspecified condition associated with female genital organs and menstrual cycle: Secondary | ICD-10-CM

## 2014-03-26 MED ORDER — LINACLOTIDE 145 MCG PO CAPS: 145.0000 ug | ORAL_CAPSULE | Freq: Every day | ORAL | Status: DC

## 2014-03-26 NOTE — Progress Notes (Signed)
   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 She was seen in the office for the first time as a new patient on September 3 of this year.She is a gravida 4 para 4 with 4 previous cesarean sections the last one resulting in tubal sterilization. At that office visit she did complain of the following:   #1 patient for several months has had more than one period  #2 decreased libido  #3 constipation  #4 new sexual partner requesting STD screen  Her lab results were as follows: Comprehensive metabolic panel screening, cholesterol, CBC, urinalysis, and total testosterone, TSH were all normal. Her GC and chlamydia culture and HIV were all negative. Her Pap smear was normal.  Ultrasound 4/sonohysterogram: Uterus measuring 9.6 x 7.5 x 5.6 cm with endometrial strep at 13.5 mm (patient is a 21 of her cycle). Right ovary was normal. Left corpus luteum cyst with positive color flow in the periphery measuring 2.9 x 2.1 cm with internal low level echoes negative color flow. After cleansing the cervix with Betadine solution a sterile catheter was introduced into the uterine cavity normal saline was instilled. There was no intracavitary defect noted. Following this a Pipelle was introduced into the uterine cavity and tissue was obtained for histological evaluation.  Assessment/plan: Patient had previously been provided literature information on her option endometrial ablation. Patient would like to wait a few more months. She will be instructed to keep a menstrual calendar if she skips cyclosporine tumors she is to return to the office. Or if her cycles become very heavy. She declined a flu vaccine today. All the above was discussed in [place]. We will notify her with the results of endometrial biopsy as soon as it becomes available. For her chronic constipation she was prescribed Linzess 145 MCG one by mouth daily 30 minutes  before breakfast

## 2014-05-04 ENCOUNTER — Encounter: Payer: Self-pay | Admitting: Gynecology

## 2014-08-05 ENCOUNTER — Ambulatory Visit: Payer: Self-pay | Admitting: Family Medicine

## 2014-10-30 ENCOUNTER — Ambulatory Visit (INDEPENDENT_AMBULATORY_CARE_PROVIDER_SITE_OTHER): Payer: BLUE CROSS/BLUE SHIELD | Admitting: Family Medicine

## 2014-10-30 ENCOUNTER — Encounter: Payer: Self-pay | Admitting: Family Medicine

## 2014-10-30 VITALS — BP 121/78 | HR 87 | Temp 98.2°F | Ht 64.0 in | Wt 171.1 lb

## 2014-10-30 DIAGNOSIS — L608 Other nail disorders: Secondary | ICD-10-CM

## 2014-10-30 DIAGNOSIS — L609 Nail disorder, unspecified: Secondary | ICD-10-CM | POA: Diagnosis not present

## 2014-10-30 MED ORDER — TRAMADOL HCL 50 MG PO TABS: 50.0000 mg | ORAL_TABLET | Freq: Three times a day (TID) | ORAL | Status: DC | PRN

## 2014-10-30 NOTE — Patient Instructions (Signed)
Fue agradable ver que en la actualidad.  Su procedimiento fue Kimberly-Clarkmuy bien.  Mantenga el vendaje durante las prximas 24 horas.  A continuacin, puede quitarlo y reanudar el cuidado / higiene normal.  Si experimenta un aumento del enrojecimiento, fiebre, escalofros u otros signos de infeccin por favor llmenos.  Tomar la medicacin para el dolor segn sea necesario. Asegrese de su pie elevado para los Nucor Corporationprximos das.  Seguimiento en ~ 1-2 semanas para reevaluar y tambin para hacer frente a sus otras preocupaciones.  El Dr. Adriana Simasook,

## 2014-10-30 NOTE — Progress Notes (Signed)
   Subjective:    Patient ID: Dana Pope, female    DOB: April 28, 1976, 39 y.o.   MRN: [id number]  HPI 39 year old female presents for a same day appointment with complaints of great toe/toe nail pain. Phone Interpreter Herberto used during visit [phone]).  1) Great toe/toe nail pain  Patient reports a longstanding history of pain and discoloration of her L great toe nail.  She reports that this initially started years ago after suffering and injury to her toe.  She states it has been bothering her more and more over the past 3 months.  No recent fever.  No redness or drainage around/surrounding the toenail.  She has tried treating the toenail by getting pedicures/regular foot care.  No improvement with this.  Pain exacerbating by physical activity.  Patient desires removal today.  Review of Systems Per HPI    Objective:   Physical Exam Filed Vitals:   10/30/14 1444  BP: 121/78  Pulse: 87  Temp: 98.2 F (36.8 C)   Vital signs reviewed.  Exam: General: well appearing, NAD. Extremities: L foot - Great toe with splint, discolored/yellow toenail with underlying white matter (consistent with fungus). No signs of bacterial infection - no redness, drainage. 2+ dorsalis pedis and posterior tibial pulses.  Procedure:  Patient given informed consent, signed copy in the chart. Appropriate time out taken.  Rubber band used as tourniquet at base of the toe.  Digital block done using 2% lidocaine without epinephrine, 4 cc total.  Area prepped and draped in usual sterile fashion. Nail elevated using nail elevator, removed using hemostat and traction force.  Nail bed was not ablated as regrowth desired and there was no evidence of ingrown toenail noted.  No complications.  Minimal bleeding.  Bandage applied and post procedure instructions given.  Patient tolerated procedure well without complications.      Assessment & Plan:  See Problem List

## 2014-10-30 NOTE — Assessment & Plan Note (Signed)
Toenail removed successfully today. Patient tolerated the procedure well; No complications.

## 2014-12-09 ENCOUNTER — Encounter: Payer: Self-pay | Admitting: Family Medicine

## 2014-12-09 ENCOUNTER — Ambulatory Visit (INDEPENDENT_AMBULATORY_CARE_PROVIDER_SITE_OTHER): Payer: BLUE CROSS/BLUE SHIELD | Admitting: Family Medicine

## 2014-12-09 VITALS — BP 110/60 | HR 69 | Temp 98.1°F | Wt 170.0 lb

## 2014-12-09 DIAGNOSIS — N6011 Diffuse cystic mastopathy of right breast: Secondary | ICD-10-CM | POA: Insufficient documentation

## 2014-12-09 DIAGNOSIS — H8110 Benign paroxysmal vertigo, unspecified ear: Secondary | ICD-10-CM

## 2014-12-09 DIAGNOSIS — N6012 Diffuse cystic mastopathy of left breast: Secondary | ICD-10-CM | POA: Diagnosis not present

## 2014-12-09 NOTE — Assessment & Plan Note (Signed)
Discussed usual course and exaplained disease process

## 2014-12-09 NOTE — Assessment & Plan Note (Signed)
Fibrocystic breast changes by Hx and exam BL lumpy bumpy texture with tenderness, assoc with recent period No lumps concerning for neoplasm, no imaging necessary at this time Recommended 2 wek f/u for resolution, given red flags for return

## 2014-12-09 NOTE — Patient Instructions (Signed)
Great to meet you!  You have fibrocystic breast disease, this is a benign condition.    Cambios fibroqusticos en las mamas (Fibrocystic Breast Changes) Los cambios fibroqusticos en las mamas ocurren cuando los conductos mamarios se obstruyen y se producen bultos llenos de lquidoquistes) en las [place]amas. Esta es una enfermedad frecuente que no es cancerosa (es benigna). Aparece cuando una mujer atraviesa cambios hormonales durante su perodo menstrual. Los cambios fibroqusticos en las mamas pueden afectar una o Netartsambas mamas. CAUSAS  La causa exacta de los cambios fibroqusticos en las mamas no se conocen, pero pueden relacionarse con las hormonas femeninas, estrgeno y Education officer, museumprogesterona. Los rasgos familiares que se transmiten de padres a hijos (genticos) pueden ser un factor en algunos casos. SIGNOS Y SNTOMAS   Sensibilidad, molestias leves o dolor.  Hinchazn.  Sensacin de tocar cordones en la mama.  Mama con bultos, de uno o ambos lados.  Cambios en el tamao de la mama, especialmente antes (ms grandes) y despus (ms pequeos) del perodo menstrual.  Secrecin de color verde o marrn oscura por el pezn (que no es [place]). Los sntomas generalmente empeoran antes del inicio de los perodos menstruales y mejoran hacia el final de los mismos.  DIAGNSTICO  Para diagnosticarlo, el mdico le har preguntas y Education officer, environmentalrealizar un examen fsico de las mamas. El mdico podr indicar otros estudios que examinarn el interior de las [place], como:  Un estudio radiogrfico de las mamas [place](mamografa).  Ecografas.  Un estudio de imagen por Health visitorresonancia magntica. Si se sospecha algo ms que cambios fibroqusticos en las [place], el mdico podr tomar Newmont Mininguna muestra de tejido [place]mamario. (biopsia mamaria) para ser examinado. TRATAMIENTO  En general no se requiere tratamiento. El mdico podr Optometristindicar analgsicos de venta libre para Teacher, early years/predisminuir el dolor o las molestias causadas por los cambios fibroqusticos en las  [place]. Tambin podrn indicarle que cambie la dieta o que limite o deje de consumir algunos alimentos o beber lquidos que contengan cafena. Los alimentos y las bebidas que contienen cafena Honeywellincluyen el chocolate, las gaseosas, el caf y el t. Tambin puede ayudar si reduce el consumo de azcar y grasas. El mdico tambin podr indicar:  Aspiracin con aguja fina para retirar el lquido de un quiste que le cause dolor.  Ciruga para extirpar un quiste grande, persistente y sensible. INSTRUCCIONES PARA EL CUIDADO EN EL HOGAR   Examine sus mamas despus de cada perodo menstrual. Controle sus mamas el primer da de cada mes, si no tiene el perodo menstrual. [place] para detectar cambios, como ms sensibilidad, un bulto que no estaba, cambios en el tamao de las mamas o cambios en un bulto que siempre [place].  Tome slo medicamentos de venta libre o recetados, segn las indicaciones del mdico.  Use un sostn de soporte o un sostn deportivo que le ajuste bien, especialmente al hacer ejercicios.  Disminuya o evite la cafena, las grasas y Insurance claims handlerel azcar en su dieta, segn las indicaciones de su mdico. SOLICITE ATENCIN MDICA SI:   Brett FairyObserva una prdida de lquido (secrecin) por los pezones, especialmente si es un lquido sanguinolento.  Tiene nuevos bultos o ndulos en la mama.  Una o ambas mamas se agrandan, estn irritadas o le duelen.  Tiene zonas en las mamas que se hunden.  Los pezones estn planos o sangrantes. Document Released: 10/05/2008 Document Revised: 02/19/2013 [facility] Patient Information 2015 [place], [place]. This information is not intended to replace advice given to you by your health care provider. Make sure you discuss any questions  you have with your health care provider.

## 2014-12-09 NOTE — Progress Notes (Signed)
Patient ID: Dana Pope, female   DOB: 1976-03-24, 39 y.o.   MRN: [id number]   HPI  Patient presents today for  Breast tenderness  Patient explains the tenderness began with her period  This past Saturday, 4 days ago. She describes bilateral breast tenderness and lumpy bumpy texture with left greater than right tenderness. She denies any skin changes, nipple discharge , or previous breast problems. She's never had this problem before.   She denies any new medications , she's had a tubal ligation for contraception.  Dizziness Episodic dizziness described as coming on with head movement with no significant room spinning but more of an unsteadiness type character.   she denies fever, chills, sweats, dyspnea   She also notes bilateral feet numbness that occurred approximately 5 times in the last 2 weeks. She's also had some left hand numbness that is worse on her palms and her dorsal hand. She has  A normal diet and has not gained any weight lately.   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  Smoking status noted ROS: Per HPI  Objective: BP 110/60 mmHg  Pulse 69  Temp(Src) 98.1 F (36.7 C) (Oral)  Wt 170 lb (77.111 kg)  LMP 12/05/2014 Gen: NAD, alert, cooperative with exam HEENT: NCAT Ext: No edema, warm Neuro: Alert and oriented, No gross deficits   breasts: Bilateral breasts with lumpy bumpy texture, no discrete concerning masses , all lumps mobile and tender to touch, also BL chest wall tender b/w breasts. No axillary LAD  Assessment and plan:  Benign positional vertigo Discussed usual course and exaplained disease process  Fibrocystic breast changes of both breasts Fibrocystic breast changes by Hx and exam BL lumpy bumpy texture with tenderness, assoc with recent period No lumps concerning for neoplasm, no imaging necessary at this time Recommended 2 wek f/u for resolution, given red flags for return

## 2015-03-09 ENCOUNTER — Encounter: Payer: BC Managed Care – PPO | Admitting: Gynecology

## 2015-03-10 ENCOUNTER — Encounter: Payer: BC Managed Care – PPO | Admitting: Gynecology

## 2015-03-31 ENCOUNTER — Encounter: Payer: BLUE CROSS/BLUE SHIELD | Admitting: Gynecology

## 2015-04-07 ENCOUNTER — Ambulatory Visit (INDEPENDENT_AMBULATORY_CARE_PROVIDER_SITE_OTHER): Payer: BLUE CROSS/BLUE SHIELD | Admitting: Gynecology

## 2015-04-07 ENCOUNTER — Encounter: Payer: Self-pay | Admitting: Gynecology

## 2015-04-07 VITALS — BP 128/86 | Ht 63.5 in | Wt 167.0 lb

## 2015-04-07 DIAGNOSIS — F4321 Adjustment disorder with depressed mood: Secondary | ICD-10-CM

## 2015-04-07 DIAGNOSIS — Z23 Encounter for immunization: Secondary | ICD-10-CM | POA: Diagnosis not present

## 2015-04-07 DIAGNOSIS — Z01419 Encounter for gynecological examination (general) (routine) without abnormal findings: Secondary | ICD-10-CM | POA: Diagnosis not present

## 2015-04-07 LAB — CBC WITH DIFFERENTIAL/PLATELET
Basophils Absolute: 0 10*3/uL (ref 0.0–0.1)
Basophils Relative: 0 % (ref 0–1)
EOS PCT: 1 % (ref 0–5)
Eosinophils Absolute: 0.1 10*3/uL (ref 0.0–0.7)
HEMATOCRIT: 39.7 % (ref 36.0–46.0)
Hemoglobin: 13.1 g/dL (ref 12.0–15.0)
LYMPHS PCT: 21 % (ref 12–46)
Lymphs Abs: 1.3 10*3/uL (ref 0.7–4.0)
MCH: 29.2 pg (ref 26.0–34.0)
MCHC: 33 g/dL (ref 30.0–36.0)
MCV: 88.6 fL (ref 78.0–100.0)
MONO ABS: 0.3 10*3/uL (ref 0.1–1.0)
MONOS PCT: 5 % (ref 3–12)
MPV: 9.9 fL (ref 8.6–12.4)
Neutro Abs: 4.7 10*3/uL (ref 1.7–7.7)
Neutrophils Relative %: 73 % (ref 43–77)
Platelets: 282 10*3/uL (ref 150–400)
RBC: 4.48 MIL/uL (ref 3.87–5.11)
RDW: 13.1 % (ref 11.5–15.5)
WBC: 6.4 10*3/uL (ref 4.0–10.5)

## 2015-04-07 LAB — COMPREHENSIVE METABOLIC PANEL
ALBUMIN: 4.4 g/dL (ref 3.6–5.1)
ALT: 14 U/L (ref 6–29)
AST: 18 U/L (ref 10–30)
Alkaline Phosphatase: 63 U/L (ref 33–115)
BILIRUBIN TOTAL: 0.4 mg/dL (ref 0.2–1.2)
BUN: 15 mg/dL (ref 7–25)
CHLORIDE: 104 mmol/L (ref 98–110)
CO2: 28 mmol/L (ref 20–31)
Calcium: 8.9 mg/dL (ref 8.6–10.2)
Creat: 0.84 mg/dL (ref 0.50–1.10)
Glucose, Bld: 90 mg/dL (ref 65–99)
POTASSIUM: 4.2 mmol/L (ref 3.5–5.3)
Sodium: 139 mmol/L (ref 135–146)
Total Protein: 7 g/dL (ref 6.1–8.1)

## 2015-04-07 LAB — CHOLESTEROL, TOTAL: Cholesterol: 195 mg/dL (ref 125–200)

## 2015-04-07 LAB — TSH: TSH: 0.923 u[IU]/mL (ref 0.350–4.500)

## 2015-04-07 MED ORDER — PAROXETINE HCL ER 12.5 MG PO TB24: 12.5000 mg | ORAL_TABLET | ORAL | Status: DC

## 2015-04-07 MED ORDER — LINACLOTIDE 145 MCG PO CAPS: 145.0000 ug | ORAL_CAPSULE | Freq: Every day | ORAL | Status: DC

## 2015-04-07 NOTE — Patient Instructions (Signed)
Paroxetine Controlled-Release Tablets Qu es este medicamento? La PAROXETINA se utiliza para tratar la depresin. Este medicamento tambin puede ayudar a tratar personas con trastornos de ansiedad, trastorno obsesivo-compulsivo, ataques de pnico, estrs postraumtico o trastorno disfrico premenstrual (TDPM). Este medicamento puede ser utilizado para otros usos; si tiene alguna pregunta consulte con su proveedor de atencin mdica o con su farmacutico. Qu le debo informar a mi profesional de la salud antes de tomar este medicamento? Necesita saber si usted presenta alguno de los siguientes problemas o situaciones: -trastornos de hemorragias -glaucoma -enfermedad cardiaca -enfermedad renal -enfermedad heptica -bajos niveles de sodio en la sangre -mana o trastorno bipolar -convulsiones -ideas suicidas, planes o intento; si usted o alguien de su familia ha intentado un suicidio previo -si ha tomado un IMAO, tales como Carbex, Eldepryl, Marplan, Nardil o Parnate -si toma medicamentos que tratan o previenen cogulos sanguneos -una reaccin alrgica o inusual a la paroxetina, a otros medicamentos, alimentos, colorantes o conservadores -si est embarazada o buscando quedar embarazada -si est amamantando a un beb Cmo debo utilizar este medicamento? Tome este medicamento por va oral con un vaso de agua. Siga las instrucciones de la etiqueta del medicamento. Este medicamento se puede tomar con o sin alimentos. No triture ni mastique este medicamento. Tome sus dosis a intervalos regulares. No tome su medicamento con una frecuencia mayor a la indicada. No deje de tomar este medicamento repentinamente a menos que as indique su mdico. El detener este medicamento demasiado rpido puede causar efectos secundarios graves o puede empeorar su condicin. Su farmacutico le dar una gua del medicamento especial con cada receta y relleno. Asegrese de leer esta informacin cada vez  cuidadosamente. Hable con su pediatra para informarse acerca del uso de este medicamento en nios. Puede requerir atencin especial. Sobredosis: Pngase en contacto inmediatamente con un centro toxicolgico o una sala de urgencia si usted cree que haya tomado demasiado medicamento. ATENCIN: Este medicamento es solo para usted. No comparta este medicamento con nadie. Qu sucede si me olvido de una dosis? Si olvida una dosis, tmela lo antes posible. Si es casi la hora de la prxima dosis, tome slo esa dosis. No tome dosis adicionales o dobles. Qu puede interactuar con este medicamento? No tome esta medicina con ninguno de los siguientes medicamentos: -linezolid -IMAOs, tales como Carbex, Eldepryl, Marplan, Nardil y Parnate -azul de metileno (inyectada va intravenosa) -pimozida -tioridazina Esta medicina tambin puede interactuar con los siguientes medicamentos: -alcohol -anticidos -aspirina o medicamentos tipo aspirina -atomoxetina -ciertos medicamentos para la depresin, ansiedad o trastornos psicticos -ciertos medicamentos para el ritmo cardaco irregular, tales como propafenona, flecainida, encainida, y quinidina -ciertos medicamentos para migraas, tales como almotriptn, eletriptn, frovatriptn, naratriptn, riztriptn, sumatriptn, zolmitriptn -cimetidina -digoxina -diurticos -fentanilo -fosamprenavir/ritonavir -furazolidona -isoniazida -litio -medicamentos que tratan o previenen cogulos sanguneos, tales como warfarina, enoxaparina y dalteparina -medicamentos para conciliar el sueo -metoprolol -los AINE, medicamentos para el dolor o inflamacin, tales como ibuprofeno o naproxeno -fenobarbital -fenitona -procarbazina -prociclidina -rasagilina -suplementos como hierba de [place], kava kava, valeriana -tamoxifeno -teofilina -tramadol -triptfano Puede ser que esta lista no menciona todas las posibles interacciones. Informe a su profesional de la salud de  todos los productos a base de hierbas, medicamentos de venta libre o suplementos nutritivos que est tomando. Si usted fuma, consume bebidas alcohlicas o si utiliza drogas ilegales, indqueselo tambin a su profesional de la salud. Algunas sustancias pueden interactuar con su medicamento. A qu debo estar atento al usar este medicamento? Informe a su mdico si sus sntomas no   mejoran o si empeoran. Visite a su mdico o a su profesional de la salud para chequear su evolucin peridicamente. Debido que puede ser necesario tomar este medicamento durante varias semanas para que sea posible observar sus efectos en forma completa, es importante que sigue su tratamiento como recetado por su mdico. Los pacientes y sus familias deben estar atentos si empeora la depresin o ideas suicidas. Tambin est atento a cambios repentinos o severos de emocin como el sentirse ansioso, agitado, lleno de pnico, irritable, hostil, agresivo, impulsivo, inquietud severa, demasiado excitado y hiperactivo o dificultad para conciliar el sueo. Si esto ocurre, especialmente al comenzar con el tratamiento de un antidepresivo o al cambiar de dosis, comunquese con su profesional de la salud. Puede experimentar somnolencia o mareos. No conduzca ni utilice maquinaria, ni haga nada que le exija permanecer en estado de alerta hasta que sepa cmo le afecta este medicamento. No se siente ni se ponga de pie con rapidez, especialmente si es un paciente de edad avanzada. Esto reduce el riesgo de mareos o desmayos. El alcohol puede interferir con el efecto de este medicamento. Evite consumir bebidas alcohlicas. Se le podr secar la boca. Masticar chicle sin azcar, chupar caramelos duros y beber agua en abundancia le ayudar a mantener la boca hmeda. Qu efectos secundarios puedo tener al utilizar este medicamento? Efectos secundarios que debe informar a su mdico o a su profesional de la salud tan pronto como sea posible: -reacciones  alrgicas como erupcin cutnea, picazn o urticarias, hinchazn de la cara, labios o lengua -heces de color oscuro o de aspecto alquitranado, sangre el la orina o vomito -pulso cardiaco rpido, irregular -alucinaciones, prdida del contacto con la realidad -ereccin dolorosa o prolongada (en los hombres) -convulsiones -ideas suicidas u otros cambios de humor -dificultad para orinar o cambios en el volumen de orina -sangrado, magulladuras inusuales -cansancio o debilidad inusual -vmito Efectos secundarios que, por lo general, no requieren atencin mdica (debe informarlos a su mdico o a su profesional de la salud si persisten o si son molestos): -cambios en el apetito, peso -cambios en el deseo sexual o capacidad -estreimiento o diarrea -dificultad para conciliar el sueo -somnolencia -dolor de cabeza -aumento de la sudoracin -dolor o debilidad muscular -temblores Puede ser que esta lista no menciona todos los posibles efectos secundarios. Comunquese a su mdico por asesoramiento mdico sobre los efectos secundarios. Usted puede informar los efectos secundarios a la FDA por telfono al 1-800-FDA-1088. Dnde debo guardar mi medicina? Mantngala fuera del alcance de los nios. Gurdela a una temperatura de o menos de 25 grados C (77 grados F). Deseche todo el medicamento que no haya utilizado, despus de la fecha de vencimiento. ATENCIN: Este folleto es un resumen. Puede ser que no cubra toda la posible informacin. Si usted tiene preguntas acerca de esta medicina, consulte con su mdico, su farmacutico o su profesional de la salud.    2016, [employer] Standard. (2014-08-12 00:00:00)  

## 2015-04-07 NOTE — Progress Notes (Signed)
Dana Pope 08/28/1975 [id number]   History:    39 y.o.  for annual gyn exam  Who is complaining of been anxious at times and someone feeling withdrawn and depressed at others. She has no suicidal deviation is good family support. She feel she stressed at work at home. Patient with 4 cesarean sections and tubal ligation. Patient is now reporting normal menstrual cycles. Patient interested in flu vaccine today. Patient stated approximately 10 years ago she had some form of abnormal Pap smear had colposcopy and biopsy but no treatment and was followed up with subsequent Pap smears which have been normal.  Past medical history,surgical history, family history and social history were all reviewed and documented in the EPIC chart.  Gynecologic History Patient's last menstrual period was 03/24/2015. Contraception: tubal ligation Last Pap:  2015. Results were: normal Last mammogram:  Not indicated. Results were:  Not indicated  Obstetric History OB History  Gravida Para Term Preterm AB SAB TAB Ectopic Multiple Living  # Outcome Date GA Lbr Len/2nd Weight Sex Delivery Anes PTL Lv  4 Para           3 Para           2 Para           1 Para                ROS: A ROS was performed and pertinent positives and negatives are included in the history.  GENERAL: No fevers or chills. HEENT: No change in vision, no earache, sore throat or sinus congestion. NECK: No pain or stiffness. CARDIOVASCULAR: No chest pain or pressure. No palpitations. PULMONARY: No shortness of breath, cough or wheeze. GASTROINTESTINAL: No abdominal pain, nausea, vomiting or diarrhea, melena or bright red blood per rectum. GENITOURINARY: No urinary frequency, urgency, hesitancy or dysuria. MUSCULOSKELETAL: No joint or muscle pain, no back pain, no recent trauma. DERMATOLOGIC: No rash, no itching, no lesions. ENDOCRINE: No polyuria, polydipsia, no heat or cold intolerance. No recent change in weight.  HEMATOLOGICAL: No anemia or easy bruising or bleeding. NEUROLOGIC: No headache, seizures, numbness, tingling or weakness. PSYCHIATRIC: No depression, no loss of interest in normal activity or change in sleep pattern.     Exam: chaperone present  BP 128/86 mmHg  Ht 5' 3.5" (1.613 m)  Wt 167 lb (75.751 kg)  BMI 29.12 kg/m2  LMP 03/24/2015  Body mass index is 29.12 kg/(m^2).  General appearance : Well developed well nourished female. No acute distress HEENT: Eyes: no retinal hemorrhage or exudates,  Neck supple, trachea midline, no carotid bruits, no thyroidmegaly Lungs: Clear to auscultation, no rhonchi or wheezes, or rib retractions  Heart: Regular rate and rhythm, no murmurs or gallops Breast:Examined in sitting and supine position were symmetrical in appearance, no palpable masses or tenderness,  no skin retraction, no nipple inversion, no nipple discharge, no skin discoloration, no axillary or supraclavicular lymphadenopathy Abdomen: no palpable masses or tenderness, no rebound or guarding Extremities: no edema or skin discoloration or tenderness  Pelvic:  Bartholin, Urethra, Skene Glands: Within normal limits             Vagina: No gross lesions or discharge  Cervix: No gross lesions or discharge  Uterus   anteverted, normal size, shape and consistency, non-tender and mobile  Adnexa  Without masses or tenderness  Anus and perineum  normal   Rectovaginal  normal sphincter tone without  palpated masses or tenderness             Hemoccult  Not indicated     Assessment/Plan:  39 y.o. female for annual exam  Patient with signs and symptoms consisting of anxiety an depression. She is going to be started on Paxil CR 12.5 mg by mouth daily. The risk medicine pros and cons of this medication were discussed with the patient and literature information was provided in [place]. We discussed importance of monthly breast exam. Patient will receive her flu vaccine today. Patient will have the  following screening blood work drawn today: Screening cholesterol, comprehensive metabolic panel, TSH, CBC, and urinalysis. Pap smear not done today.   Dana Edwards MD, 4:09 PM 04/07/2015

## 2015-04-08 LAB — URINALYSIS W MICROSCOPIC + REFLEX CULTURE
BILIRUBIN URINE: NEGATIVE
Bacteria, UA: NONE SEEN [HPF]
Casts: NONE SEEN [LPF]
Crystals: NONE SEEN [HPF]
GLUCOSE, UA: NEGATIVE
Hgb urine dipstick: NEGATIVE
Ketones, ur: NEGATIVE
LEUKOCYTES UA: NEGATIVE
NITRITE: NEGATIVE
Protein, ur: NEGATIVE
SPECIFIC GRAVITY, URINE: 1.017 (ref 1.001–1.035)
WBC UA: NONE SEEN WBC/HPF (ref <=5)
Yeast: NONE SEEN [HPF]
pH: 7 (ref 5.0–8.0)

## 2015-04-09 LAB — URINE CULTURE

## 2015-06-14 ENCOUNTER — Ambulatory Visit: Payer: BC Managed Care – PPO | Admitting: Family Medicine

## 2015-06-15 ENCOUNTER — Emergency Department (HOSPITAL_COMMUNITY)
Admission: EM | Admit: 2015-06-15 | Discharge: 2015-06-15 | Disposition: A | Discharge status: Home / Self Care | Payer: BLUE CROSS/BLUE SHIELD | Source: Home / Self Care | Attending: Emergency Medicine | Admitting: Emergency Medicine

## 2015-06-15 ENCOUNTER — Emergency Department (INDEPENDENT_AMBULATORY_CARE_PROVIDER_SITE_OTHER): Payer: BLUE CROSS/BLUE SHIELD

## 2015-06-15 ENCOUNTER — Encounter (HOSPITAL_COMMUNITY): Payer: Self-pay | Admitting: Emergency Medicine

## 2015-06-15 DIAGNOSIS — R6889 Other general symptoms and signs: Secondary | ICD-10-CM

## 2015-06-15 MED ORDER — CETIRIZINE HCL 10 MG PO TABS: 10.0000 mg | ORAL_TABLET | Freq: Every day | ORAL | Status: DC

## 2015-06-15 MED ORDER — HYDROCODONE-HOMATROPINE 5-1.5 MG/5ML PO SYRP: 5.0000 mL | ORAL_SOLUTION | Freq: Four times a day (QID) | ORAL | Status: DC | PRN

## 2015-06-15 NOTE — ED Provider Notes (Signed)
CSN: [id number]     Arrival date & time 06/15/15  1625 History   First MD Initiated Contact with Patient 06/15/15 1634     Chief Complaint  Patient presents with  . Generalized Body Aches  . Cough   (Consider location/radiation/quality/duration/timing/severity/associated sxs/prior Treatment) HPI  She is a 39 year old woman here for evaluation of cough. She reports cough and body aches for about 2 weeks, but worse in the last 5 days. She also reports nasal congestion and postnasal drainage. She reports a headache every time she coughs. She is unable to sleep at night due to the cough. She also describes muscle aches, primarily in her legs. She reports intermittently feeling dizzy. She has loss of appetite, but has been drinking lots of water. She reports subjective fevers yesterday and Sunday. Denies nausea, vomiting, and diarrhea. No wheezing or shortness of breath.  Past Medical History  Diagnosis Date  . Migraine    Past Surgical History  Procedure Laterality Date  . Tubal ligation    . Cesarean section      X4   Family History  Problem Relation Age of Onset  . Diabetes Mother   . Hypertension Mother   . Cancer Maternal Grandmother     UTERINE  . Cancer Paternal Grandmother     SKIN   Social History  Substance Use Topics  . Smoking status: Never Smoker   . Smokeless tobacco: None  . Alcohol Use: 0.0 oz/week    0 Standard drinks or equivalent per week     Comment: occasional   OB History    Gravida Para Term Preterm AB TAB SAB Ectopic Multiple Living   4 4        4      Review of Systems As in history of present illness Allergies  Amoxicillin  Home Medications   Prior to Admission medications   Medication Sig Start Date End Date Taking? Authorizing Provider  cetirizine (ZYRTEC) 10 MG tablet Take 1 tablet (10 mg total) by mouth daily. 06/15/15   Charm RingsErin J Tannen Vandezande, MD  Collagen 500 MG CAPS Take by mouth.    Historical Provider, MD  HYDROcodone-homatropine (HYCODAN)  5-1.5 MG/5ML syrup Take 5 mLs by mouth every 6 (six) hours as needed for cough. 06/15/15   Charm RingsErin J Treson Laura, MD  PARoxetine (PAXIL-CR) 12.5 MG 24 hr tablet Take 1 tablet (12.5 mg total) by mouth every morning. 04/07/15   Ok EdwardsJuan H Fernandez, MD  vitamin B-12 (CYANOCOBALAMIN) 100 MCG tablet Take 100 mcg by mouth daily.    Historical Provider, MD  vitamin E 100 UNIT capsule Take by mouth daily.    Historical Provider, MD   Meds Ordered and Administered this Visit  Medications - No data to display  BP 123/78 mmHg  Pulse 101  Temp(Src) 98.1 F (36.7 C) (Oral)  Resp 18  SpO2 98%  LMP 06/15/2015 Orthostatic VS for the past 24 hrs:  BP- Lying Pulse- Lying BP- Sitting Pulse- Sitting BP- Standing at 0 minutes Pulse- Standing at 0 minutes  06/15/15 1706 114/69 mmHg 90 120/74 mmHg 94 109/69 mmHg 105    Physical Exam  Constitutional: She is oriented to person, place, and time. She appears well-developed and well-nourished. No distress.  HENT:  Nose: Nose normal.  Mouth/Throat: Oropharynx is clear and moist. No oropharyngeal exudate.  Neck: Neck supple.  Cardiovascular: Regular rhythm and normal heart sounds.   No murmur heard. Mild tachycardia  Pulmonary/Chest: Effort normal and breath sounds normal. No respiratory distress. She has  no wheezes. She has no rales.  Lymphadenopathy:    She has no cervical adenopathy.  Neurological: She is alert and oriented to person, place, and time.    ED Course  Procedures (including critical care time)  Labs Review Labs Reviewed - No data to display  Imaging Review Dg Chest 2 View  06/15/2015  CLINICAL DATA:  Per pt: two weeks ago started getting sick, cough, head and chest congestion, sore throat. No history of cardiac or respiratory disease. Non-smoker EXAM: CHEST - 2 VIEW COMPARISON:  None available FINDINGS: Lungs are clear. Heart size and mediastinal contours are within normal limits. No effusion. Visualized skeletal structures are unremarkable.  IMPRESSION: No acute cardiopulmonary disease. Electronically Signed   By: Corlis Leak M.D.   On: 06/15/2015 17:46     MDM   1. Flu-like symptoms    Symptomatic treatment with cetirizine and Hycodan. Return precautions reviewed.    Charm Rings, MD 06/15/15 [phone]

## 2015-06-15 NOTE — ED Notes (Signed)
[facility] a complaint of a cough and general body aches x2 weeks but has gotten worse over the last 5 days.

## 2015-06-15 NOTE — Discharge Instructions (Signed)
You likely have the flu. Make sure you are drinking plenty of fluids and getting plenty of rest. Takes cetirizine daily for the next week to help with the congestion. Use the Hycodan every 4 hours as needed for cough. Do not drive while taking this medicine. You should start to feel better by Friday. Follow-up as needed.

## 2015-07-16 ENCOUNTER — Ambulatory Visit (INDEPENDENT_AMBULATORY_CARE_PROVIDER_SITE_OTHER): Payer: BLUE CROSS/BLUE SHIELD | Admitting: Family Medicine

## 2015-07-16 VITALS — BP 128/83 | HR 83 | Temp 98.3°F | Wt 159.3 lb

## 2015-07-16 DIAGNOSIS — R1013 Epigastric pain: Secondary | ICD-10-CM | POA: Diagnosis not present

## 2015-07-16 LAB — POCT H PYLORI SCREEN: H Pylori Screen, POC: NEGATIVE

## 2015-07-16 MED ORDER — OMEPRAZOLE 40 MG PO CPDR
40.0000 mg | DELAYED_RELEASE_CAPSULE | Freq: Every day | ORAL | Status: DC
Start: 2015-07-16 — End: 2015-08-15

## 2015-07-16 NOTE — Patient Instructions (Addendum)
We will check for a bacteria that can cause your symptoms. Medication sent to pharmacy. Please take once a day for four weeks. Follow up in 4 weeks.  lcera pptica  (Peptic Ulcer)  La lcera pptica es una llaga dolorosa en la membrana que recubre el esfago (lcera esofgica), el estmago (lcera gstrica), o la primera parte del intestino delgado (lcera duodenal). La lcera causa erosin en los tejidos profundos.  CAUSAS  Normalmente, el revestimiento del estmago y del intestino delgado se protegen a s mismos del cido con que se digieren los alimentos. El revestimiento protector puede daarse debido a:   Una infeccin causada por una bacteria llamada Helicobacter pylori (H. pylori).  El uso regular de medicamentos anti-inflamatorios no esteroides (AINE), como el ibuprofeno o la aspirina.  El consumo de tabaco. Otros factores de riesgo incluyen ser mayor de 50 aos, el consumo de alcohol en exceso y Wilburt Finlaytener antecedentes familiares de lcera.  SNTOMAS   Dolor quemante o punzante en la zona entre el pecho y el ombligo.  Acidez.  Nuseas y vmitos.  Hinchazn. El dolor empeora con el estmago vaco y por la noche. Si la lcera sangra, puede causar:   Materia fecal de color negro alquitranado.  Vmito de sangre roja brillante.  Vmito de aspecto similar a la borra del caf. DIAGNSTICO  El diagnstico se realiza basndose en la historia clnica y el examen fsico. Para encontrar las causas de la lcera podrn indicarle otros estudios y procedimientos. Encontrar la causa ayudar a Futures traderdeterminar el mejor tratamiento. Los estudios y procedimientos pueden incluir:   Anlisis de sangre, anlisis de materia fecal, o estudios del aliento para Engineer, manufacturingdetectar la bacteria H. pylori.  Una seriada del tracto gastrointestinal (GI) del esfago, el estmago y el intestino delgado.  Una endoscopia para examinar el esfago, el estmago y el intestino delgado.  Una biopsia. TRATAMIENTO  El  tratamiento incluye:   La eliminacin de la causa de la lcera, como el tabaquismo, los LuanaAINE o el alcohol.  Medicamentos para reducir la cantidad de cido en el tracto digestivo.  Antibiticos si la causa de la lcera es la bacteria H. pylori.  Una endoscopia superior para tratar Rolan Lipauna lcera sangrante.  Ciruga si el sangrado es grave o si la lcera ha perforado Event organiseralgn lugar en el sistema digestivo. INSTRUCCIONES PARA EL CUIDADO EN EL HOGAR   Evite el tabaco, el alcohol y la cafena. El fumar puede aumentar el cido en el estmago y el tabaquismo continuado no favorecer la curacin de las lceras.  Evite los alimentos y las bebidas que le parece que le causan molestias o que le agravan su lcera.  Tome slo la medicacin que le indic el profesional. No tome sustitutos de venta libre de los medicamentos recetados sin Science writerconsultar a su mdico.  Cumpla con las consultas de control y hgase los estudios segn las indicaciones. SOLICITE ATENCIN MDICA SI:   La infeccin no mejora dentro de los [address]  [address]das despus de Programmer, systemsiniciar el tratamiento.  Siente indigestin o [place] Islandsacidez de manera continua. SOLICITE ATENCIN MDICA DE INMEDIATO SI:   Siente un dolor repentino y agudo o persistente en el abdomen.  La materia fecal es sanguinolenta o negra, de aspecto alquitranado.  Vomita sangre o el vmito tiene el aspecto similar a la borra del caf.  Si se siente mareado, dbil o que va a desmayarse.  Se siente transpirado o sudoroso. ASEGRESE DE QUE:   Comprende estas instrucciones.  Controlar su enfermedad.  Solicitar ayuda de inmediato si  no mejora o si empeora.   Esta informacin no tiene Theme park manager el consejo del mdico. Asegrese de hacerle al mdico cualquier pregunta que tenga.   Document Released: 03/29/2005 Document Revised: 03/13/2012 Elsevier Interactive Patient Education 2016 Elsevier Inc.  Enfermedad por reflujo gastroesofgico en los adultos (Gastroesophageal Reflux  Disease, Adult) Normalmente, los alimentos descienden por el esfago y se depositan en el estmago para su digestin. Sin embargo, cuando una persona tiene enfermedad por reflujo gastroesofgico (ERGE), los alimentos y el cido estomacal regresan al esfago. Cuando esto ocurre, el esfago se irrita y se inflama. Con el tiempo, la ERGE puede provocar la formacin de pequeas perforaciones (lceras) en la mucosa del esfago.  CAUSAS Un problema del msculo que se encuentra entre el esfago y Investment banker, corporate (esfnter esofgico inferior o EEI) es la causa de esta enfermedad. Por lo general, el esfnter esofgico inferior se cierra despus de que los alimentos pasan a travs del esfago hacia el [place]. Cuando el EEI est debilitado o no es normal, no se cierra correctamente, lo que permite el paso retrgrado de los alimentos y el cido estomacal al esfago. Algunas sustancias de la dieta, algunos medicamentos y Materials engineer enfermedades pueden debilitar este esfnter, entre ellos:  Consumo de tabaco.  [place].  Hernia de hiato.  Consumo excesivo de alcohol.  Algunos alimentos y [address], como el caf, el chocolate, las cebollas y Interior and spatial designer. FACTORES DE RIESGO Es ms probable que esta afeccin se manifieste en:  Los personas con sobrepeso.  Las personas con trastornos del tejido conjuntivo.  Las personas que toman antiinflamatorios no esteroides (AINE). SNTOMAS Los sntomas de esta afeccin incluyen lo siguiente:  Merchant navy officer.  Dificultad o dolor al tragar.  Sensacin de Warehouse manager un bulto en la garganta.  Sabor amargo en la boca.  Mal aliento.  Gran cantidad de saliva.  Malestar estomacal o meteorismo.  Flatulencias.  Dolor en el pecho.  Falta de aire o sibilancias.  Tos permanente (crnica) o tos nocturna.  Desgaste el esmalte dental.  Prdida de peso. El dolor en el pecho puede deberse a muchas afecciones diferentes. Consulte al mdico si tiene Engineer, manufacturing. DIAGNSTICO El mdico le har una historia clnica y un examen fsico. Para determinar si la ERGE es leve o grave, el mdico tambin puede controlar la respuesta al [place]. Tambin pueden hacerle otros estudios, por ejemplo:  Una endoscopia para examinar el estmago y el esfago con Neomia Dear pequea cmara.  Un estudio que determina el nivel de acidez en el esfago.  Un estudio que mide la presin que hay en el esfago.  Un estudio de deglucin de bario o un estudio modificado de deglucin de bario para mostrar la forma, el tamao y el funcionamiento del esfago. TRATAMIENTO El objetivo del tratamiento es aliviar los sntomas y Automotive engineer las complicaciones. El tratamiento de esta afeccin puede variar en funcin de la gravedad de los sntomas. El mdico podr indicar lo siguiente:  Cambios en la dieta.  Medicamentos.  Ciruga. INSTRUCCIONES PARA EL CUIDADO EN EL HOGAR Dieta  Siga la dieta que le haya recomendado el mdico, la cual puede incluir evitar alimentos y bebidas tales como:  Caf y t (con o sin cafena).  Bebidas que contengan alcohol.  Bebidas energizantes y deportivas.  Gaseosas o refrescos.  Chocolate y cacao.  Menta y esencias de [address]  Ajo y cebollas.  Rbano picante.  Alimentos muy condimentados y cidos, entre ellos, pimientos, [place] en polvo, curry en polvo, vinagre, salsas  picantes y [employer].  Frutas ctricas y sus jugos, como naranjas, limones y limas.  Alimentos a base de tomates, como salsa roja, [place], salsa y pizza con salsa roja.  Alimentos fritos y Lexicographer, como rosquillas, papas fritas y aderezos con alto contenido de Holiday representative.  Carnes con alto contenido de [place], como hot dogs y cortes grasos de carnes rojas y blancas, por ejemplo, filetes de entrecot, salchicha, jamn y tocino.  Productos lcteos con alto contenido de [place], como [place], [place] y queso crema.  Haga comidas pequeas y frecuentes Freight forwarder  de comidas abundantes.  Evite beber mucho lquido con las comidas.  No coma durante las 2 o 3horas previas a la hora de [place].  No se acueste inmediatamente despus de comer.  No haga actividad fsica enseguida despus de comer. Instrucciones generales  Est atento a cualquier cambio en los sntomas.  Tome los medicamentos de venta libre y los recetados solamente como se lo haya indicado el mdico. No tome aspirina, ibuprofeno ni otros antiinflamatorios no esteroides (AINE), a menos que se lo haya indicado el mdico.  No consuma ningn producto que contenga tabaco, lo que incluye cigarrillos, tabaco de Theatre manager y Administrator, Civil Service. Si necesita ayuda para dejar de fumar, consulte al mdico.  Use ropas sueltas. No use prendas ajustadas alrededor de la cintura que ejerzan presin en el abdomen.  Levante (eleve) 6pulgadas (15centmetros) la cabecera de la cama.  Trate de reducir [employer] de estrs con actividades como el yoga o la meditacin. Si necesita ayuda para reducir [employer] de estrs, consulte al mdico.  Si tiene sobrepeso, Media planner un peso saludable. Hable con el mdico acerca de su peso ideal y pdale asesoramiento en cuanto a la dieta que debe seguir para Aeronautical engineer.  Concurra a todas las visitas de control como se lo haya indicado el mdico. Esto es importante. SOLICITE ATENCIN MDICA SI:  Aparecen nuevos sntomas.  Baja de peso sin causa aparente.  Tiene dificultad para tragar o siente dolor al [employer].  Tiene sibilancias o tos persistente.  Los sntomas no mejoran con Scientist, research (medical).  Tiene la voz ronca. SOLICITE ATENCIN MDICA DE [employer] SI:  Tiene dolor en los brazos, el cuello, los [place], la dentadura o la espalda.  Berenice Primas, se marea o tiene sensacin de desvanecimiento.  Siente falta de aire o Journalist, newspaper.  Vomita y el vmito es parecido a la sangre o a los granos de caf.  Se desmaya.  Las heces son  sanguinolentas o de color negro.  No puede tragar, beber o comer.   Esta informacin no tiene Theme park manager el consejo del mdico. Asegrese de hacerle al mdico cualquier pregunta que tenga.   Document Released: 03/29/2005 Document Revised: 03/10/2015 Elsevier Interactive Patient Education [employer]

## 2015-07-19 NOTE — Progress Notes (Signed)
Subjective:     Patient ID: Dana Pope, female   DOB: 1976-06-03, 40 y.o.   MRN: [id number]  HPI [name] is a 40yo female presenting today for abdominal pain. - Pain sharp - Located in epigastric area. Denies radiation up esophagus. - Symptoms for two weeks, intermittent - Denies nausea, vomiting, diarrhea, constipation - Worse with food and with lying down at night - Reports increased stress lately  - Smoking status reviewed  Review of Systems Per HPI. Other systems negative.    Objective:   Physical Exam  Constitutional: She appears well-developed and well-nourished. No distress.  HENT:  Head: Normocephalic and atraumatic.  Cardiovascular: Normal rate.  Exam reveals no gallop and no friction rub.   No murmur heard. Pulmonary/Chest: Effort normal. No respiratory distress. She has no wheezes.  Abdominal: Soft. She exhibits no distension.  Epigastric tenderness  Psychiatric: She has a normal mood and affect. Her behavior is normal.      Assessment and Plan:     1. Abdominal pain, epigastric - Suspect gastric ulcer +/-. GERD -  H.pylori screen screen negative - Omeprazole (PRILOSEC) 40 MG capsule; Take 1 capsule (40 mg total) by mouth daily.  Dispense: 30 capsule; Refill: 0  - Follow up in 4 weeks

## 2015-08-15 ENCOUNTER — Other Ambulatory Visit: Payer: Self-pay | Admitting: Family Medicine

## 2015-10-29 ENCOUNTER — Ambulatory Visit: Payer: BC Managed Care – PPO | Admitting: Family Medicine

## 2016-02-21 ENCOUNTER — Ambulatory Visit (HOSPITAL_COMMUNITY)
Admission: RE | Admit: 2016-02-21 | Discharge: 2016-02-21 | Disposition: A | Discharge status: Home / Self Care | Payer: BLUE CROSS/BLUE SHIELD | Source: Ambulatory Visit | Attending: Student | Admitting: Student

## 2016-02-21 ENCOUNTER — Ambulatory Visit (INDEPENDENT_AMBULATORY_CARE_PROVIDER_SITE_OTHER): Payer: BLUE CROSS/BLUE SHIELD | Admitting: Student

## 2016-02-21 ENCOUNTER — Other Ambulatory Visit: Payer: Self-pay | Admitting: Student

## 2016-02-21 ENCOUNTER — Encounter: Payer: Self-pay | Admitting: Student

## 2016-02-21 VITALS — BP 126/80 | HR 67 | Temp 97.7°F | Wt 174.0 lb

## 2016-02-21 DIAGNOSIS — M7989 Other specified soft tissue disorders: Secondary | ICD-10-CM | POA: Diagnosis not present

## 2016-02-21 DIAGNOSIS — M79604 Pain in right leg: Secondary | ICD-10-CM | POA: Insufficient documentation

## 2016-02-21 DIAGNOSIS — M79671 Pain in right foot: Secondary | ICD-10-CM | POA: Diagnosis not present

## 2016-02-21 DIAGNOSIS — R52 Pain, unspecified: Secondary | ICD-10-CM

## 2016-02-21 NOTE — Assessment & Plan Note (Addendum)
This is concerning for fibula fracture. Didn't feel warm to suspect cellulitis. This reassuring that pain is improving although it is only a little bit. She is ambulating normally.  -X-ray of the right leg -Ibuprofen 600 mg 3 times a day for pain and inflammation -Advised to apply ice and cold compression

## 2016-02-21 NOTE — Progress Notes (Signed)
   Subjective:    Patient ID: Dana Pope is a 40 y.o. old female.  CC:   HPI #Pain in right leg: pain for 9 days. She fell off a ladder about 9 days ago and had a bruise as a result of that. She also had swelling that is not improving. She went to medical dispensary at work and was given antibiotic and tetanus vaccination. She told them she was bitten by spider because she was ashamed to tell them she fell off. She did not take the antibiotic. Pain worse with walking but she is able to be a weight on it. Over all pain is improving. She is also taking ibuprofen 800 mg 1-2 times a day. Ibuprofen helps a little bit. She denies fever or chills She also reports bruise on her left thigh and right arm.    PMH: reviewed SH:   -Workers on a Bankerphotography machine  -Lives with children and husband.  -Denies domestic violence  Review of Systems Per HPI Objective:   Vitals:   02/21/16 1619  BP: 126/80  Pulse: 67  Temp: 97.7 F (36.5 C)  TempSrc: Oral  Weight: 174 lb (78.9 kg)    GEN: appears well, no apparent distress. CVS: Dorsalis pedis pulses 2+ bilaterally RESP: no increased work of breathing MSK: Noted circular swelling about an inch in diameter over her lower lateral aspect of her right leg, she is also tender to palpation over this area. No cough tenderness. Swollen area doesn't feel warmer than adjacent skin. (See picture below) SKIN: Wide areas of resolving ecchymosis over the lateral aspect of her lower right leg and dorsal aspect of her right foot. He also circular areas of ecchymosis about 2 inch in diameter over her left thigh posteriorly. (See picture below).       NEURO: alert and oriented appropriately, no gross defecits  PSYCH: appropriate mood and affect     Assessment & Plan:  Right leg pain This is concerning for fibula fracture. Didn't feel warm to suspect cellulitis. This reassuring that pain is improving although it is only a little bit. She is  ambulating normally.  -X-ray of the right leg -Ibuprofen 600 mg 3 times a day for pain and inflammation

## 2016-02-21 NOTE — Patient Instructions (Addendum)
It was great seeing you today! We have addressed the following issues today  Right leg pain: I will be ordering an x-ray of your leg and foot today. I recommend taking ibuprofen 600 mg up to three times a day. Take this medication with food. I also recommend ice and cold compression for swelling. Please come back and see us if the swelling or pain get worse or if you happen to have fever.   If we did any lab work today, and the results require attention, either me or my nurse will get in touch with you. If everything is normal, you will get a letter in mail. If you don't hear from us in two weeks, please give us a call. Otherwise, I look forward to talking with you again at our next visit. If you have any questions or concerns before then, please call the clinic at [phone].  Please bring all your medications to every doctors visit   Sign up for My Chart to have easy access to your labs results, and communication with your Primary care physician.    Please check-out at the front desk before leaving the clinic.   Take Care,

## 2016-02-22 ENCOUNTER — Encounter: Payer: Self-pay | Admitting: Student

## 2016-04-07 ENCOUNTER — Encounter: Payer: BLUE CROSS/BLUE SHIELD | Admitting: Gynecology

## 2016-04-14 ENCOUNTER — Encounter: Payer: BLUE CROSS/BLUE SHIELD | Admitting: Gynecology

## 2016-04-26 ENCOUNTER — Encounter: Payer: Self-pay | Admitting: Gynecology

## 2016-04-26 ENCOUNTER — Ambulatory Visit (INDEPENDENT_AMBULATORY_CARE_PROVIDER_SITE_OTHER): Payer: BLUE CROSS/BLUE SHIELD | Admitting: Gynecology

## 2016-04-26 VITALS — BP 118/76 | Ht 63.5 in | Wt 172.0 lb

## 2016-04-26 DIAGNOSIS — Z01419 Encounter for gynecological examination (general) (routine) without abnormal findings: Secondary | ICD-10-CM

## 2016-04-26 NOTE — Progress Notes (Signed)
Dana Pope 1976/06/23 [id number]   History:    40 y.o.  for annual gyn exam with no complaints today. Patient recently treated for a right leg cellulitis. Patient no longer taking Paxil.Patient with 4 cesarean sections and tubal ligation. Patient is now reporting normal menstrual cycles.Patient stated approximately 10 years ago she had some form of abnormal Pap smear had colposcopy and biopsy but no treatment and was followed up with subsequent Pap smears which have been normal.  Past medical history,surgical history, family history and social history were all reviewed and documented in the EPIC chart.  Gynecologic History Patient's last menstrual period was 04/14/2016. Contraception: tubal ligation Last Pap: 2015. Results were: normal Last mammogram: Has not had yet. Results were: No previous study  Obstetric History OB History  Gravida Para Term Preterm AB Living  4 4       4   SAB TAB Ectopic Multiple Live Births               # Outcome Date GA Lbr Len/2nd Weight Sex Delivery Anes PTL Lv  4 Para           3 Para           2 Para           1 Para                ROS: A ROS was performed and pertinent positives and negatives are included in the history.  GENERAL: No fevers or chills. HEENT: No change in vision, no earache, sore throat or sinus congestion. NECK: No pain or stiffness. CARDIOVASCULAR: No chest pain or pressure. No palpitations. PULMONARY: No shortness of breath, cough or wheeze. GASTROINTESTINAL: No abdominal pain, nausea, vomiting or diarrhea, melena or bright red blood per rectum. GENITOURINARY: No urinary frequency, urgency, hesitancy or dysuria. MUSCULOSKELETAL: No joint or muscle pain, no back pain, no recent trauma. DERMATOLOGIC: No rash, no itching, no lesions. ENDOCRINE: No polyuria, polydipsia, no heat or cold intolerance. No recent change in weight. HEMATOLOGICAL: No anemia or easy bruising or bleeding. NEUROLOGIC: No headache, seizures, numbness,  tingling or weakness. PSYCHIATRIC: No depression, no loss of interest in normal activity or change in sleep pattern.     Exam: chaperone present  BP 118/76   Ht 5' 3.5" (1.613 m)   Wt 172 lb (78 kg)   LMP 04/14/2016 Comment: tubal ligation   BMI 29.99 kg/m   Body mass index is 29.99 kg/m.  General appearance : Well developed well nourished female. No acute distress HEENT: Eyes: no retinal hemorrhage or exudates,  Neck supple, trachea midline, no carotid bruits, no thyroidmegaly Lungs: Clear to auscultation, no rhonchi or wheezes, or rib retractions  Heart: Regular rate and rhythm, no murmurs or gallops Breast:Examined in sitting and supine position were symmetrical in appearance, no palpable masses or tenderness,  no skin retraction, no nipple inversion, no nipple discharge, no skin discoloration, no axillary or supraclavicular lymphadenopathy Abdomen: no palpable masses or tenderness, no rebound or guarding Extremities: no edema or skin discoloration or tenderness  Pelvic:  Bartholin, Urethra, Skene Glands: Within normal limits             Vagina: No gross lesions or discharge  Cervix: No gross lesions or discharge  Uterus  anteverted, normal size, shape and consistency, non-tender and mobile  Adnexa  Without masses or tenderness  Anus and perineum  normal   Rectovaginal  normal sphincter tone without palpated masses or tenderness  Hemoccult not indicated     Assessment/Plan:  40 y.o. female for annual exam is receiving her Pap smear and flu vaccine by her PCP. Patient states she was recently treated for urinary tract infection so we'll check her urinalysis today. Pap smear not indicated. Patient was provided with a requisition to schedule her baseline mammogram.   Ok Edwards MD, 4:46 PM 04/26/2016

## 2016-04-27 LAB — URINALYSIS W MICROSCOPIC + REFLEX CULTURE
BACTERIA UA: NONE SEEN [HPF]
BILIRUBIN URINE: NEGATIVE
CRYSTALS: NONE SEEN [HPF]
Casts: NONE SEEN [LPF]
GLUCOSE, UA: NEGATIVE
HGB URINE DIPSTICK: NEGATIVE
Ketones, ur: NEGATIVE
Nitrite: NEGATIVE
PROTEIN: NEGATIVE
RBC / HPF: NONE SEEN RBC/HPF (ref <=2)
SQUAMOUS EPITHELIAL / LPF: NONE SEEN [HPF] (ref <=5)
Specific Gravity, Urine: 1.009 (ref 1.001–1.035)
YEAST: NONE SEEN [HPF]
pH: 6.5 (ref 5.0–8.0)

## 2016-04-29 LAB — URINE CULTURE

## 2016-05-02 ENCOUNTER — Other Ambulatory Visit: Payer: Self-pay | Admitting: Anesthesiology

## 2016-05-02 MED ORDER — DOXYCYCLINE HYCLATE 100 MG PO CAPS: 100.0000 mg | ORAL_CAPSULE | Freq: Two times a day (BID) | ORAL | 0 refills | Status: AC

## 2016-06-19 ENCOUNTER — Encounter (HOSPITAL_COMMUNITY): Payer: Self-pay | Admitting: *Deleted

## 2016-06-19 ENCOUNTER — Encounter (HOSPITAL_COMMUNITY): Payer: Self-pay

## 2016-06-19 ENCOUNTER — Emergency Department (HOSPITAL_COMMUNITY)
Admission: EM | Admit: 2016-06-19 | Discharge: 2016-06-20 | Disposition: A | Discharge status: Home / Self Care | Payer: BLUE CROSS/BLUE SHIELD | Attending: Emergency Medicine | Admitting: Emergency Medicine

## 2016-06-19 ENCOUNTER — Ambulatory Visit (HOSPITAL_COMMUNITY)
Admission: EM | Admit: 2016-06-19 | Discharge: 2016-06-19 | Disposition: A | Discharge status: Home / Self Care | Payer: BLUE CROSS/BLUE SHIELD | Attending: Family Medicine | Admitting: Family Medicine

## 2016-06-19 ENCOUNTER — Emergency Department (HOSPITAL_COMMUNITY): Payer: BLUE CROSS/BLUE SHIELD

## 2016-06-19 DIAGNOSIS — R079 Chest pain, unspecified: Secondary | ICD-10-CM

## 2016-06-19 DIAGNOSIS — M546 Pain in thoracic spine: Secondary | ICD-10-CM | POA: Diagnosis not present

## 2016-06-19 DIAGNOSIS — R0789 Other chest pain: Secondary | ICD-10-CM | POA: Diagnosis not present

## 2016-06-19 DIAGNOSIS — Z79899 Other long term (current) drug therapy: Secondary | ICD-10-CM | POA: Insufficient documentation

## 2016-06-19 LAB — COMPREHENSIVE METABOLIC PANEL
ALBUMIN: 4.1 g/dL (ref 3.5–5.0)
ALT: 16 U/L (ref 14–54)
ANION GAP: 9 (ref 5–15)
AST: 22 U/L (ref 15–41)
Alkaline Phosphatase: 58 U/L (ref 38–126)
BUN: 11 mg/dL (ref 6–20)
CO2: 24 mmol/L (ref 22–32)
Calcium: 9.2 mg/dL (ref 8.9–10.3)
Chloride: 105 mmol/L (ref 101–111)
Creatinine, Ser: 0.54 mg/dL (ref 0.44–1.00)
GFR calc non Af Amer: >60 mL/min (ref >60)
GLUCOSE: 99 mg/dL (ref 65–99)
POTASSIUM: 3.6 mmol/L (ref 3.5–5.1)
SODIUM: 138 mmol/L (ref 135–145)
TOTAL PROTEIN: 7.1 g/dL (ref 6.5–8.1)
Total Bilirubin: 0.8 mg/dL (ref 0.3–1.2)

## 2016-06-19 LAB — CBC
HEMATOCRIT: 38.6 % (ref 36.0–46.0)
HEMOGLOBIN: 13.1 g/dL (ref 12.0–15.0)
MCH: 30.1 pg (ref 26.0–34.0)
MCHC: 33.9 g/dL (ref 30.0–36.0)
MCV: 88.7 fL (ref 78.0–100.0)
Platelets: 243 10*3/uL (ref 150–400)
RBC: 4.35 MIL/uL (ref 3.87–5.11)
RDW: 12.6 % (ref 11.5–15.5)
WBC: 6.4 10*3/uL (ref 4.0–10.5)

## 2016-06-19 LAB — I-STAT TROPONIN, ED: Troponin i, poc: 0 ng/mL (ref 0.00–0.08)

## 2016-06-19 LAB — LIPASE, BLOOD: Lipase: 29 U/L (ref 11–51)

## 2016-06-19 LAB — TROPONIN I: Troponin I: <0.03 ng/mL (ref <0.03)

## 2016-06-19 MED ORDER — ONDANSETRON 4 MG PO TBDP
4.0000 mg | ORAL_TABLET | Freq: Once | ORAL | Status: AC
  Administered 2016-06-19: 4 mg via ORAL
  Filled 2016-06-19: qty 1

## 2016-06-19 MED ORDER — MORPHINE SULFATE (PF) 4 MG/ML IV SOLN
4.0000 mg | Freq: Once | INTRAVENOUS | Status: AC
  Administered 2016-06-19: 4 mg via INTRAMUSCULAR
  Filled 2016-06-19: qty 1

## 2016-06-19 NOTE — ED Provider Notes (Signed)
[facility]ENCY DEPT Provider Note   CSN: [id number] Arrival date & time: 06/19/16  1653   History   Chief Complaint Chief Complaint  Patient presents with  . epigastric pain/ thoracic back pain    HPI Dana Pope is a 40 y.o. female.  HPI   40 yo female with a recent history of substernal abdominal discomfort and bilateral thoracic back pain. Back pain is chief complaint, chronic x1 month, worse with exertion, improves with ibuprofen and heat- hx of chronic abdominal pain. Abdominal pain is x 24 hours, waxing and waning, unchanged with food or rest. She complains of diffuse body aches, no nausea or vomiting. She was seen at the Urgent Care and transferred to the ED for CP r/o. Denies shortness of breath. Searing pain, LE swelling. Denies trauma or injury. Patient states she has had stress at work and is concerned that this could be stress related.   Past Medical History:  Diagnosis Date  . Migraine     Patient Active Problem List   Diagnosis Date Noted  . Nonspecific chest pain 06/19/2016  . Right leg pain 02/21/2016  . Fibrocystic breast changes of both breasts 12/09/2014  . Toenail deformity 10/30/2014  . DUB (dysfunctional uterine bleeding) 03/05/2014  . Decreased libido 03/05/2014  . Unspecified constipation 03/05/2014  . Benign positional vertigo 12/21/2010  . PALPITATIONS 08/03/2008  . OVERWEIGHT 01/06/2008  . DEPRESSION 01/06/2008  . BACK PAIN, CHRONIC 01/06/2008  . HEADACHE 01/06/2008    Past Surgical History:  Procedure Laterality Date  . CESAREAN SECTION     X4  . TUBAL LIGATION      OB History    Gravida Para Term Preterm AB Living   4 4       4    SAB TAB Ectopic Multiple Live Births                   Home Medications    Prior to Admission medications   Medication Sig Start Date End Date Taking? Authorizing Provider  ibuprofen (ADVIL,MOTRIN) 200 MG tablet Take 200 mg by mouth every 6 (six) hours as needed.   Yes Historical  Provider, MD  cetirizine (ZYRTEC) 10 MG tablet Take 1 tablet (10 mg total) by mouth daily. Patient not taking: Reported on 06/19/2016 06/15/15   Charm Rings, MD  HYDROcodone-acetaminophen (NORCO/VICODIN) 5-325 MG tablet Take 1-2 tablets by mouth every 4 (four) hours as needed. 06/20/16   Jye Fariss Neva Seat, PA-C  HYDROcodone-homatropine (HYCODAN) 5-1.5 MG/5ML syrup Take 5 mLs by mouth every 6 (six) hours as needed for cough. Patient not taking: Reported on 06/19/2016 06/15/15   Charm Rings, MD  omeprazole (PRILOSEC) 40 MG capsule TAKE ONE CAPSULE BY MOUTH DAILY Patient not taking: Reported on 06/19/2016 08/17/15   Myra Rude, MD  PARoxetine (PAXIL-CR) 12.5 MG 24 hr tablet Take 1 tablet (12.5 mg total) by mouth every morning. Patient not taking: Reported on 06/19/2016 04/07/15   Ok Edwards, MD    Family History Family History  Problem Relation Age of Onset  . Diabetes Mother   . Hypertension Mother   . Cancer Maternal Grandmother     UTERINE  . Cancer Paternal Grandmother     SKIN    Social History Social History  Substance Use Topics  . Smoking status: Never Smoker  . Smokeless tobacco: Never Used  . Alcohol use 0.0 oz/week     Comment: occasional     Allergies   Amoxicillin   Review of Systems  Review of Systems  Review of Systems All other systems negative except as documented in the HPI. All pertinent positives and negatives as reviewed in the HPI.  Physical Exam Updated Vital Signs BP 115/78   Pulse 65   Temp 98.5 F (36.9 C) (Oral)   Resp 18   LMP 05/31/2016   SpO2 100%   Physical Exam  Constitutional: She appears well-developed and well-nourished. No distress.  HENT:  Head: Normocephalic and atraumatic.  Nose: Nose normal.  Mouth/Throat: Uvula is midline, oropharynx is clear and moist and mucous membranes are normal.  Eyes: Pupils are equal, round, and reactive to light.  Neck: Normal range of motion. Neck supple.  Cardiovascular: Normal rate  and regular rhythm.   Pulmonary/Chest: Effort normal.  Abdominal: Soft.  No signs of abdominal distention  Musculoskeletal:  No LE swelling Symmetrical upper extremity strengths  Neurological: She is alert.  Acting at baseline  Skin: Skin is warm and dry. No rash noted.  Nursing note and vitals reviewed.   ED Treatments / Results  Labs (all labs ordered are listed, but only abnormal results are displayed) Labs Reviewed  LIPASE, BLOOD  COMPREHENSIVE METABOLIC PANEL  CBC  TROPONIN I  I-STAT TROPOININ, ED    EKG  EKG Interpretation None       Radiology Dg Chest 2 View  Result Date: 06/19/2016 CLINICAL DATA:  40 y/o F; central chest pain radiating to the left upper back. EXAM: CHEST  2 VIEW COMPARISON:  06/15/2015 chest radiograph FINDINGS: The heart size and mediastinal contours are within normal limits and stable. Both lungs are clear. The visualized skeletal structures are unremarkable. IMPRESSION: No active cardiopulmonary disease. Electronically Signed   By: Mitzi HansenLance  Furusawa-Stratton M.D.   On: 06/19/2016 22:42    Procedures Procedures (including critical care time)  Medications Ordered in ED Medications  morphine 4 MG/ML injection 4 mg (4 mg Intramuscular Given 06/19/16 2358)  ondansetron (ZOFRAN-ODT) disintegrating tablet 4 mg (4 mg Oral Given 06/19/16 2358)     Initial Impression / Assessment and Plan / ED Course  I have reviewed the triage vital signs and the nursing notes.  Pertinent labs & imaging results that were available during my care of the patient were reviewed by me and considered in my medical decision making (see chart for details).  Clinical Course     Sheria LangGONZALEZTOLEDO, Brandalynn [id number] 19-Jun-2016 16:55:48 [facility] ROUTINE RECORD Normal sinus rhythm Rightward axis Cannot rule out Anterior infarct , age undetermined Abnormal ECG 4225mm/s 5910mm/mV 100Hz  9.0.4 12SL 241 HD CID: 45 Unconfirmed Vent. rate 78 BPM PR  interval 142 ms QRS duration 76 ms QT/QTc 382/435 ms P-R-T axes 51 97 55 22-Aug-1975 (40 yr) Female Caucasian Room:UC06 Loc:19 Technician: [id number] Test ind:CP  Final Clinical Impressions(s) / ED Diagnoses   Final diagnoses:  Chest pain, unspecified type  Thoracic back pain, unspecified back pain laterality, unspecified chronicity    Patient is to be discharged with recommendation to follow up with PCP in regards to today's hospital visit. Chest pain is not likely of cardiac or pulmonary etiology d/t presentation, perc negative, VSS, no tracheal deviation, no JVD or new murmur, RRR, breath sounds equal bilaterally, EKG without acute abnormalities, negative troponin, and negative CXR. Return to the ED is CP becomes exertional, associated with diaphoresis or nausea, radiates to left jaw/arm, worsens or becomes concerning in any way. Pt appears reliable for follow up and is agreeable to discharge.   Case has been discussed with and seen by  Dr. Rubin Payorpickering who agrees with the above plan to discharge.  New Prescriptions New Prescriptions   HYDROCODONE-ACETAMINOPHEN (NORCO/VICODIN) 5-325 MG TABLET    Take 1-2 tablets by mouth every 4 (four) hours as needed.     Marlon Peliffany Hershey Knauer, PA-C 06/20/16 [id number]    Benjiman CoreNathan Pickering, MD 06/22/16 [id number]

## 2016-06-19 NOTE — ED Triage Notes (Signed)
Pt  Reports      Symptoms  Of       Chest    And  Upper  Back      Back     Since  Last  Week       With    Sensation of  Heart   Fluttering      Pt    Reports    Pain   Is  Worse  On  Movement  And  When  She  Takes  A  Deep  Breath

## 2016-06-19 NOTE — ED Triage Notes (Signed)
Patient sent from urgent care for 1 week of intermittent epigastric/chest pain with radiation to thoracic back. Some nausea with same. States that the pain wores around 10pm-1am and resolves duri ng day, NAD

## 2016-06-19 NOTE — Discharge Instructions (Signed)
Follow up in the ER for chest pain rule out.

## 2016-06-19 NOTE — ED Provider Notes (Signed)
CSN: [id number]     Arrival date & time 06/19/16  1538 History   None    Chief Complaint  Patient presents with  . Chest Pain   (Consider location/radiation/quality/duration/timing/severity/associated sxs/prior Treatment) Patient presents with 24 hour history of chest discomfort, radiating to the back and right arm, states pain is worse with exertion and reports having had palpitations this morning. Denies shortness of breath, has had some dizziness. Pain unchanged with food or rest and patient denies history of trauma.  Patient does state she has had stress at work and is concerned that this could be stress related.    The history is provided by the patient. The history is limited by a language barrier. No language interpreter was used.  Chest Pain  Pain location:  Substernal area and epigastric Pain quality: burning and dull   Pain radiates to:  L arm Pain severity:  Moderate Onset quality:  Gradual Duration:  1 day Timing:  Sporadic Progression:  Waxing and waning Chronicity:  New Context: lifting and movement   Relieved by:  None tried Worsened by:  Exertion Ineffective treatments:  None tried Associated symptoms: back pain, dizziness, palpitations and weakness   Associated symptoms: no abdominal pain, no diaphoresis, no fatigue, no headache, no heartburn, no nausea, no numbness, no shortness of breath, no syncope and no vomiting   Risk factors: high cholesterol   Risk factors: no smoking     Past Medical History:  Diagnosis Date  . Migraine    Past Surgical History:  Procedure Laterality Date  . CESAREAN SECTION     X4  . TUBAL LIGATION     Family History  Problem Relation Age of Onset  . Diabetes Mother   . Hypertension Mother   . Cancer Maternal Grandmother     UTERINE  . Cancer Paternal Grandmother     SKIN   Social History  Substance Use Topics  . Smoking status: Never Smoker  . Smokeless tobacco: Never Used  . Alcohol use 0.0 oz/week     Comment:  occasional   OB History    Gravida Para Term Preterm AB Living   4 4       4    SAB TAB Ectopic Multiple Live Births                 Review of Systems  Constitutional: Negative for chills, diaphoresis and fatigue.  HENT: Negative for congestion, sinus pain and sinus pressure.   Respiratory: Negative for chest tightness, shortness of breath and wheezing.   Cardiovascular: Positive for chest pain and palpitations. Negative for syncope.  Gastrointestinal: Negative for abdominal pain, constipation, diarrhea, heartburn, nausea and vomiting.  Musculoskeletal: Positive for back pain.  Neurological: Positive for dizziness, weakness and light-headedness. Negative for numbness and headaches.    Allergies  Amoxicillin  Home Medications   Prior to Admission medications   Medication Sig Start Date End Date Taking? Authorizing Provider  cetirizine (ZYRTEC) 10 MG tablet Take 1 tablet (10 mg total) by mouth daily. Patient not taking: Reported on 04/26/2016 06/15/15   Charm RingsErin J Honig, MD  Collagen 500 MG CAPS Take by mouth.    Historical Provider, MD  HYDROcodone-homatropine (HYCODAN) 5-1.5 MG/5ML syrup Take 5 mLs by mouth every 6 (six) hours as needed for cough. Patient not taking: Reported on 04/26/2016 06/15/15   Charm RingsErin J Honig, MD  omeprazole (PRILOSEC) 40 MG capsule TAKE ONE CAPSULE BY MOUTH DAILY Patient not taking: Reported on 04/26/2016 08/17/15   Marye RoundJeremy E  Jordan LikesSchmitz, MD  PARoxetine (PAXIL-CR) 12.5 MG 24 hr tablet Take 1 tablet (12.5 mg total) by mouth every morning. Patient not taking: Reported on 04/26/2016 04/07/15   Ok EdwardsJuan H Fernandez, MD  vitamin B-12 (CYANOCOBALAMIN) 100 MCG tablet Take 100 mcg by mouth daily.    Historical Provider, MD  vitamin E 100 UNIT capsule Take by mouth daily.    Historical Provider, MD   Meds Ordered and Administered this Visit  Medications - No data to display  BP 125/67 (BP Location: Left Arm)   Pulse 85   Temp 98.2 F (36.8 C) (Oral)   Resp 16   LMP  05/31/2016   SpO2 100%  No data found.   Physical Exam  Constitutional: She is oriented to person, place, and time. She appears well-developed and well-nourished. No distress.  HENT:  Head: Normocephalic.  Right Ear: External ear normal.  Left Ear: External ear normal.  Eyes: Pupils are equal, round, and reactive to light.  Neck: Normal range of motion. Neck supple.  Cardiovascular: Normal rate, regular rhythm, S1 normal, S2 normal, normal heart sounds and normal pulses.   No murmur heard. Pulses:      Radial pulses are 2+ on the right side, and 2+ on the left side.       Dorsalis pedis pulses are 2+ on the right side, and 2+ on the left side.  Pulmonary/Chest: Effort normal and breath sounds normal. She has no wheezes. She has no rales.  Abdominal: Soft. She exhibits no mass. There is no tenderness. There is no rebound and no guarding.  Neurological: She is alert and oriented to person, place, and time.  Skin: Skin is warm and dry. Capillary refill takes less than 2 seconds. She is not diaphoretic. No pallor.  Psychiatric: She has a normal mood and affect.  Nursing note and vitals reviewed.   Urgent Care Course   Clinical Course     Procedures (including critical care time)  Labs Review Labs Reviewed - No data to display   12 lead EKG obtained: Normal Sinus Rhythm without acute findings  Imaging Review No results found.   Visual Acuity Review  Right Eye Distance:   Left Eye Distance:   Bilateral Distance:    Right Eye Near:   Left Eye Near:    Bilateral Near:         MDM   1. Nonspecific chest pain      Discharged to the ER for chest pain rule out.     Dorena BodoLawrence Lunden Mcleish, NP 06/19/16 1736

## 2016-06-20 MED ORDER — HYDROCODONE-ACETAMINOPHEN 5-325 MG PO TABS: 1.0000 | ORAL_TABLET | ORAL | 0 refills | Status: DC | PRN

## 2016-11-15 ENCOUNTER — Encounter: Payer: Self-pay | Admitting: Gynecology

## 2017-03-28 DIAGNOSIS — M9902 Segmental and somatic dysfunction of thoracic region: Secondary | ICD-10-CM | POA: Diagnosis not present

## 2017-03-28 DIAGNOSIS — M25511 Pain in right shoulder: Secondary | ICD-10-CM | POA: Diagnosis not present

## 2017-03-28 DIAGNOSIS — M9901 Segmental and somatic dysfunction of cervical region: Secondary | ICD-10-CM | POA: Diagnosis not present

## 2017-03-28 DIAGNOSIS — M531 Cervicobrachial syndrome: Secondary | ICD-10-CM | POA: Diagnosis not present

## 2017-04-20 ENCOUNTER — Ambulatory Visit: Payer: BC Managed Care – PPO | Admitting: Internal Medicine

## 2017-04-20 ENCOUNTER — Ambulatory Visit: Payer: BLUE CROSS/BLUE SHIELD | Admitting: Student in an Organized Health Care Education/Training Program

## 2017-04-20 NOTE — Progress Notes (Deleted)
   CC: ***  HPI: Dana Pope is a 41 y.o. female with PMH significant for overweight, depression, BPPV, [facility] *** of *** duration.   Overdue health maintenance ***  Review of Symptoms:  See HPI for ROS.   CC, SH/smoking status, and VS noted.  Objective: There were no vitals taken for this visit. GEN: NAD, alert, cooperative, and pleasant.*** EYE: no conjunctival injection, pupils equally round and reactive to light ENMT: normal tympanic light reflex, no nasal polyps,no rhinorrhea, no pharyngeal erythema or exudates NECK: full ROM, no thyromegaly RESPIRATORY: clear to auscultation bilaterally with no wheezes, rhonchi or rales, good effort CV: RRR, no m/r/g, no peripheral edema GI: soft, non-tender, non-distended, no hepatosplenomegaly SKIN: warm and dry, no rashes or lesions NEURO: II-XII grossly intact, normal gait, peripheral sensation intact PSYCH: AAOx3, appropriate affect  Flu Vaccine: *** Tdap Vaccine: *** - every 2063yrs - (<3 lifetime doses or unknown): all wounds -- look up need for Tetanus IG - (>=3 lifetime doses): clean/minor wound if >1863yrs from previous; all other wounds if >3122yrs from previous Zoster Vaccine: *** (those >50yo, once) Pneumonia Vaccine: *** (those w/ risk factors) - (<4760yr) Both: Immunocompromised, cochlear implant, CSF leak, asplenic, sickle cell, Chronic Renal Failure - (<5960yr) PPSV-23 only: Heart dz, lung disease, DM, tobacco abuse, alcoholism, cirrhosis/liver disease. - (>1060yr): PPSV13 then PPSV23 in 6-12mths;  - (>10760yr): repeat PPSV23 once if pt received prior to 41yo and 6222yrs have passed  Assessment and plan:  No problem-specific Assessment & Plan notes found for this encounter.   No orders of the defined types were placed in this encounter.   No orders of the defined types were placed in this encounter.    Howard PouchLauren Lopez Dentinger, MD,MS,  PGY2 04/20/2017 12:07 PM

## 2017-05-02 DIAGNOSIS — M25511 Pain in right shoulder: Secondary | ICD-10-CM | POA: Diagnosis not present

## 2017-05-02 DIAGNOSIS — M531 Cervicobrachial syndrome: Secondary | ICD-10-CM | POA: Diagnosis not present

## 2017-05-02 DIAGNOSIS — M9901 Segmental and somatic dysfunction of cervical region: Secondary | ICD-10-CM | POA: Diagnosis not present

## 2017-05-02 DIAGNOSIS — M9902 Segmental and somatic dysfunction of thoracic region: Secondary | ICD-10-CM | POA: Diagnosis not present

## 2017-05-28 ENCOUNTER — Encounter: Payer: BLUE CROSS/BLUE SHIELD | Admitting: Obstetrics & Gynecology

## 2017-05-28 DIAGNOSIS — Z0289 Encounter for other administrative examinations: Secondary | ICD-10-CM

## 2017-11-08 ENCOUNTER — Ambulatory Visit: Payer: BLUE CROSS/BLUE SHIELD | Admitting: Family Medicine

## 2018-01-11 ENCOUNTER — Ambulatory Visit (INDEPENDENT_AMBULATORY_CARE_PROVIDER_SITE_OTHER): Payer: BLUE CROSS/BLUE SHIELD | Admitting: Student in an Organized Health Care Education/Training Program

## 2018-01-11 ENCOUNTER — Other Ambulatory Visit: Payer: Self-pay

## 2018-01-11 ENCOUNTER — Encounter: Payer: Self-pay | Admitting: Student in an Organized Health Care Education/Training Program

## 2018-01-11 VITALS — BP 124/74 | HR 77 | Temp 98.8°F | Ht 63.5 in | Wt 184.6 lb

## 2018-01-11 DIAGNOSIS — R109 Unspecified abdominal pain: Secondary | ICD-10-CM | POA: Insufficient documentation

## 2018-01-11 LAB — POCT URINALYSIS DIP (MANUAL ENTRY)
BILIRUBIN UA: NEGATIVE mg/dL
Bilirubin, UA: NEGATIVE
Blood, UA: NEGATIVE
Glucose, UA: NEGATIVE mg/dL
Leukocytes, UA: NEGATIVE
Nitrite, UA: NEGATIVE
PROTEIN UA: NEGATIVE mg/dL
Urobilinogen, UA: 0.2 E.U./dL
pH, UA: 6.5 (ref 5.0–8.0)

## 2018-01-11 LAB — POCT URINE PREGNANCY: Preg Test, Ur: NEGATIVE

## 2018-01-11 NOTE — Progress Notes (Signed)
   CC: abdominal pain  HPI: Dana Pope is a 42 y.o. female with PMH significant for cesarean delivery 10 years ago and hx tubal ligation who [facility] pain of 2 months duration.   Pain is localized over her previous cesarean scar from 10 years ago. Pain is worst when she is sexually active, when she is walking. No palliating factors.  Last BM was this morning. No Diarrhea. No dysuria, urinary frequency or urgency. No vaginal discharge or dyspareunia. Hx of tubal ligation, no other contraception. She is sexually active with one female partner but she does not feel she has been exposted to [employer]'s as he was recently tested and negative for infection.   Review of Symptoms:  See HPI for ROS.   CC, SH/smoking status, and VS noted.  Objective: BP 124/74   Pulse 77   Temp 98.8 F (37.1 C) (Oral)   Ht 5' 3.5" (1.613 m)   Wt 184 lb 9.6 oz (83.7 kg)   LMP 12/20/2017 (Exact Date)   SpO2 99%   BMI 32.19 kg/m  GEN: NAD, alert, cooperative, and pleasant. RESPIRATORY: clear to auscultation bilaterally with no wheezes, rhonchi or rales, good effort CV: RRR, no m/r/g, no peripheral edema GI: soft, +mild tenderness right along old cesarean scar. No tenderness to deep palpation. No palpable masses. No fibrotic changes along scar line. No redness, edema, or warmth. Normal auscultated bowel sounds. SKIN: warm and dry, no rashes or lesions NEURO: II-XII grossly intact PSYCH: AAOx3, appropriate affect  Assessment and plan:  Abdominal pain Uncertain etiology. Pain w/palpable tenderness is right along cesarean scar, however superficially no abnormality or scar tissue palpable.  - refusing STI workup - refusing pelvic exam - will test pelvic ultrasound with request that they visualize superficially to rule out endometrioma (though less likely without cyclical nature to pain) - will add on pregnancy test to r/o ectopic    Orders Placed This Encounter  Procedures    . US PELVIC COMPLETE WITH TRANSVAGINAL    Standing Status:   Future    Standing Expiration Date:   03/15/2019    Order Specific Question:   Reason for Exam (SYMPTOM  OR DIAGNOSIS REQUIRED)    Answer:   Abdominal pain along cesarean  scar-Please visiulalize superficially to rule out endometrioma    Order Specific Question:   Preferred imaging [facility]?    [facility]  . POCT urinalysis dipstick  . POCT urine pregnancy    No orders of the defined types were placed in this encounter.    Howard PouchLauren Mckala Pantaleon, MD,MS,  PGY2 01/17/2018 4:50 AM

## 2018-01-11 NOTE — Assessment & Plan Note (Signed)
Uncertain etiology. Pain w/palpable tenderness is right along cesarean scar, however superficially no abnormality or scar tissue palpable.  - refusing STI workup - refusing pelvic exam - will test pelvic ultrasound with request that they visualize superficially to rule out endometrioma (though less likely without cyclical nature to pain) - will add on pregnancy test to r/o ectopic

## 2018-01-11 NOTE — Patient Instructions (Signed)
It was a pleasure seeing you today in our clinic. Today we discussed abdominal pain. Here is the treatment plan we have discussed and agreed upon together:  We ordered a pelvic ultrasound at today's visit. I will call or send you a letter with these results. If you do not hear from me within the next week, please give our office a call.  Our clinic's number is [phone]. Please call with questions or concerns about what we discussed today.  Be well, Dr. Mosetta PuttFeng

## 2018-01-16 ENCOUNTER — Ambulatory Visit (HOSPITAL_COMMUNITY): Payer: BLUE CROSS/BLUE SHIELD | Attending: Family Medicine

## 2018-03-14 ENCOUNTER — Ambulatory Visit (INDEPENDENT_AMBULATORY_CARE_PROVIDER_SITE_OTHER): Payer: BLUE CROSS/BLUE SHIELD | Admitting: Family Medicine

## 2018-03-14 ENCOUNTER — Encounter: Payer: Self-pay | Admitting: Family Medicine

## 2018-03-14 ENCOUNTER — Other Ambulatory Visit: Payer: Self-pay

## 2018-03-14 VITALS — BP 116/76 | HR 81 | Temp 98.4°F | Ht 64.0 in | Wt 182.6 lb

## 2018-03-14 DIAGNOSIS — S46811D Strain of other muscles, fascia and tendons at shoulder and upper arm level, right arm, subsequent encounter: Secondary | ICD-10-CM

## 2018-03-14 DIAGNOSIS — Z23 Encounter for immunization: Secondary | ICD-10-CM | POA: Diagnosis not present

## 2018-03-14 MED ORDER — DICLOFENAC SODIUM 1 % TD GEL: 4.0000 g | Freq: Four times a day (QID) | TRANSDERMAL | 2 refills | Status: DC

## 2018-03-14 NOTE — Patient Instructions (Signed)
Thank you for coming in to see us today. Please see below to review our plan for today's visit.  Your pain seems to be related to a muscle strain. I do think you job is playing a role. Stop the ibuprofen and tylenol. Start the Voltaren gel 4 times daily and apply to affected area. Wash hands after applying. If your pain persists for another 2 weeks without improvement, please return.  Please call the clinic at [phone] if your symptoms worsen or you have any concerns. It was our pleasure to serve you.  Durward Parcelavid McMullen, DO [facility], PGY-3

## 2018-03-14 NOTE — Progress Notes (Signed)
   Subjective   Patient ID: Dana Pope    DOB: Dec 16, 1975, 42 y.o. female   MRN: [id number] [employer] interpreter used during interview: Johnny BridgeMartha [phone] (Spanish)  CC: "Back and shoulder pain"  HPI: Dana Pope is a 42 y.o. female who presents to clinic today for the following:  BACK PAIN  Onset: 3 months Pain characteristic: constant with hot and numb sensation Patient has tried: ibuprofen and tylenol without improvement Pain radiates: no History of trauma or injury: yes, 2 years ago, see below Prior history of similar pain: yes History of cancer: no Weak immune system: no History of IV drug use: no History of steroid use: no  Symptoms Incontinence of bowel or bladder: no Saddle anesthesia: no Radiculopathy: no Fever: no Rest or nocturnal pain: no Weight Loss: no Rash: no  Of note, this began after a fall 2 years ago but has recently flared up. 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  ROS: see HPI for pertinent.  PMFSH: Chronic back pain, overweight, depression, BPPV, AUB.  Trickle history C-section, tubal.  Family history DM, HTN.  Smoking status reviewed. Medications reviewed.  Objective   BP 116/76   Pulse 81   Temp 98.4 F (36.9 C) (Oral)   Ht 5\' 4"  (1.626 m)   Wt 182 lb 9.6 oz (82.8 kg)   LMP 03/10/2018 (Exact Date)   SpO2 99%   BMI 31.34 kg/m  Vitals and nursing note reviewed.  General: well nourished, well developed, NAD with non-toxic appearance HEENT: normocephalic, atraumatic, moist mucous membranes Neck: supple, non-tender without lymphadenopathy, full passive range of motion Cardiovascular: regular rate and rhythm without murmurs, rubs, or gallops Lungs: clear to auscultation bilaterally with normal work of breathing Skin: warm, dry, no rashes or lesions, cap refill < 2 seconds MSK: no central spine tenderness, 5/5 motor strength in 4 extremities, increased tone to right trapezius on right with moderate tenderness  on palpation, there is mild tenderness to the right AC joint, full passive range of motion intact with upper extremities bilaterally  Assessment & Plan   Strain of right trapezius muscle Chronic.  Appears to be localized to right trapezius.  Suspect this is secondary to strain.  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  Rotator cuff appears to be unaffected.  Cervical spine also unaffected.  No signs of radiculopathy.  Patient requesting muscle relaxer though given chronicity, with unlikely help long-term. - Advised to discontinue Tylenol and ibuprofen given lack of improvement - Given prescription for Voltaren gel percent with instructions to apply 4 times daily as needed - RTC 2 weeks if no improvement at which point we could consider muscle relaxer or duloxetine possibly, may warrant ultrasound  Orders Placed This Encounter  Procedures  . Flu Vaccine QUAD 36+ mos IM   Meds ordered this encounter  Medications  . diclofenac sodium (VOLTAREN) 1 % GEL    Sig: Apply 4 g topically 4 (four) times daily.    Dispense:  1 Tube    Refill:  2    Durward Parcelavid McMullen, DO [facility], PGY-3 03/15/2018, 8:44 AM

## 2018-03-15 ENCOUNTER — Telehealth: Payer: Self-pay

## 2018-03-15 DIAGNOSIS — S46811A Strain of other muscles, fascia and tendons at shoulder and upper arm level, right arm, initial encounter: Secondary | ICD-10-CM | POA: Insufficient documentation

## 2018-03-15 NOTE — Assessment & Plan Note (Addendum)
Chronic.  Appears to be localized to right trapezius.  Suspect this is secondary to strain.  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  Rotator cuff appears to be unaffected.  Cervical spine also unaffected.  No signs of radiculopathy.  Patient requesting muscle relaxer though given chronicity, with unlikely help long-term. - Advised to discontinue Tylenol and ibuprofen given lack of improvement - Given prescription for Voltaren gel percent with instructions to apply 4 times daily as needed - RTC 2 weeks if no improvement at which point we could consider muscle relaxer or duloxetine possibly, may warrant ultrasound

## 2018-03-15 NOTE — Telephone Encounter (Signed)
Received fax from pharmacy, PA needed on Diclofenac gel.  Clinical questions submitted via Cover My Meds.  Waiting on response, could take up to 72 hours.  Cover My Meds info: WUJ:[id number]:[id number]  Ples SpecterAlisa Azra Abrell, RN [facility] RN)

## 2018-03-19 NOTE — Telephone Encounter (Signed)
I am happy to fill out prior auth.  Dana Parcelavid Serrena Linderman, DO [facility], PGY-3

## 2018-03-19 NOTE — Telephone Encounter (Signed)
Diclofenac gel denied per BCBS. Note to prescriber for any follow up. [name], RN [facility])

## 2018-06-07 ENCOUNTER — Ambulatory Visit: Payer: BLUE CROSS/BLUE SHIELD | Admitting: Neurology

## 2018-06-07 ENCOUNTER — Other Ambulatory Visit: Payer: Self-pay

## 2018-06-07 ENCOUNTER — Encounter: Payer: Self-pay | Admitting: Neurology

## 2018-06-07 VITALS — BP 128/81 | HR 83 | Resp 14 | Ht 64.0 in | Wt 181.0 lb

## 2018-06-07 DIAGNOSIS — R202 Paresthesia of skin: Secondary | ICD-10-CM | POA: Diagnosis not present

## 2018-06-07 NOTE — Progress Notes (Signed)
Reason for visit: Paresthesia  Referring physician: Dr. Cameron Ali is a 42 y.o. female  History of present illness:  Dana Pope is a 23 year old right-handed Hispanic female with a 1 year history of numbness involving the shoulders bilaterally.  The patient may occasionally have some mild numbness down to the hands on both sides that may come and go.  She reports some discomfort in the neck and scapular area that seems to improve with ibuprofen and stretching.  At the beginning of October 2019, the patient began having intermittent numbness that may involve both legs from the groin level down to the feet that will come and go.  The episodes may last up to 7 days and then dissipate, she reports that prolonged sitting such as driving may bring on the symptoms.  She denies any significant low back pain, she denies any troubles controlling the bowels or bladder.  She has not had true weakness of the extremities.  She denies any numbness on the face, she does have a history of headaches throughout her life that may come on average once every 2 weeks, missing a meal or increased stress may bring on the headache.  The patient has persistence of symptoms in the shoulders and arms, not in the legs.  She is sent to this office for an evaluation.  Recent blood work to include a vitamin B12 level, thyroid profile, and hemoglobin A1c was unremarkable.  Blood work also included a chemistry profile and liver profile that were normal, BUN was 12, creatinine of 0.88.  CBC was normal.  An interpreter was used for the evaluation today.  Past Medical History:  Diagnosis Date  . Migraine   . Vision abnormalities     Past Surgical History:  Procedure Laterality Date  . CESAREAN SECTION     X4  . TUBAL LIGATION      Family History  Problem Relation Age of Onset  . Diabetes Mother   . Hypertension Mother   . Cancer Maternal Grandmother        UTERINE  . Cancer Paternal  Grandmother        SKIN  . Hypertension Father   . Healthy Sister   . Healthy Brother   . Healthy Sister   . Healthy Sister   . Healthy Sister   . Healthy Sister   . Healthy Sister   . Healthy Brother   . Healthy Brother   . Healthy Brother     Social history:  reports that she has never smoked. She has never used smokeless tobacco. She reports that she drinks alcohol. She reports that she does not use drugs.  Medications:  Prior to Admission medications   Medication Sig Start Date End Date Taking? Authorizing Provider  cetirizine (ZYRTEC) 10 MG tablet Take 1 tablet (10 mg total) by mouth daily. Patient not taking: Reported on 06/19/2016 06/15/15   Charm Rings, MD  diclofenac sodium (VOLTAREN) 1 % GEL Apply 4 g topically 4 (four) times daily. 03/14/18   Wendee Beavers, DO  ibuprofen (ADVIL,MOTRIN) 200 MG tablet Take 200 mg by mouth every 6 (six) hours as needed.    [provider]  omeprazole (PRILOSEC) 40 MG capsule TAKE ONE CAPSULE BY MOUTH DAILY Patient not taking: Reported on 06/19/2016 08/17/15   Myra Rude, MD  PARoxetine (PAXIL-CR) 12.5 MG 24 hr tablet Take 1 tablet (12.5 mg total) by mouth every morning. Patient not taking: Reported on 06/19/2016 04/07/15  Ok EdwardsFernandez, Juan H, MD      Allergies  Allergen Reactions  . Amoxicillin     REACTION: urticaria and difficulty breathing    ROS:  Out of a complete 14 system review of symptoms, the patient complains only of the following symptoms, and all other reviewed systems are negative.  Numbness  Blood pressure 128/81, pulse 83, resp. rate 14, height 5\' 4"  (1.626 m), weight 181 lb (82.1 kg).  Physical Exam  General: The patient is alert and cooperative at the time of the examination.  Eyes: Pupils are equal, round, and reactive to light. Discs are flat bilaterally.  Neck: The neck is supple, no carotid bruits are noted.  Respiratory: The respiratory examination is clear.  Cardiovascular: The  cardiovascular examination reveals a regular rate and rhythm, no obvious murmurs or rubs are noted.  Neuromuscular: Range of movement of the cervical spine and lumbar spine are normal.  Skin: Extremities are without significant edema.  Neurologic Exam  Mental status: The patient is alert and oriented x 3 at the time of the examination. The patient has apparent normal recent and remote memory, with an apparently normal attention span and concentration ability.  Cranial nerves: Facial symmetry is present. There is good sensation of the face to pinprick and soft touch on the left, decreased on the right, the patient does not split the midline with vibration sensation on the forehead. The strength of the facial muscles and the muscles to head turning and shoulder shrug are normal bilaterally. Speech is well enunciated, no aphasia or dysarthria is noted. Extraocular movements are full. Visual fields are full. The tongue is midline, and the patient has symmetric elevation of the soft palate. No obvious hearing deficits are noted.  Motor: The motor testing reveals 5 over 5 strength of all 4 extremities. Good symmetric motor tone is noted throughout.  Sensory: Sensory testing is intact to pinprick, soft touch, vibration sensation, and position sense on all 4 extremities, with exception of decreased pinprick sensation on the right arm and leg as compared to the left. No evidence of extinction is noted.  Coordination: Cerebellar testing reveals good finger-nose-finger and heel-to-shin bilaterally.  Gait and station: Gait is normal. Tandem gait is normal. Romberg is negative. No drift is seen.  Reflexes: Deep tendon reflexes are symmetric and normal bilaterally. Toes are downgoing bilaterally.   Assessment/Plan:  1.  Paresthesias, all 4 extremities  The patient objectively has a relatively normal examination, but she reports numbness and paresthesias involving both shoulders that are persistent,  intermittent with the lower extremities.  The patient has decreased sensation on the right face, arm, and leg to pinprick.  The patient will be set up for MRI of the brain and cervical spine to exclude demyelinating disease and to exclude cervical spondylosis that could be resulting in some of her symptoms.  If the studies are unremarkable, it is likely that her symptoms are benign in nature.  She will follow-up in 4 months.  Marlan Palau. Keith Meryem Haertel MD 06/07/2018 9:42 AM  [facility] [address] [place], [place] [id number]  Phone [phone] Fax [phone]

## 2018-06-10 ENCOUNTER — Telehealth: Payer: Self-pay | Admitting: Neurology

## 2018-06-10 NOTE — Telephone Encounter (Signed)
BCBS Auth: NPR via BCBS website order sent to GI. They will reach out to the pt to schedule.  °

## 2018-06-18 ENCOUNTER — Ambulatory Visit: Payer: BLUE CROSS/BLUE SHIELD | Admitting: Neurology

## 2018-07-10 ENCOUNTER — Ambulatory Visit
Admission: RE | Admit: 2018-07-10 | Discharge: 2018-07-10 | Disposition: A | Discharge status: Home / Self Care | Payer: BLUE CROSS/BLUE SHIELD | Source: Ambulatory Visit | Attending: Neurology | Admitting: Neurology

## 2018-07-10 DIAGNOSIS — R202 Paresthesia of skin: Secondary | ICD-10-CM | POA: Diagnosis not present

## 2018-07-10 MED ORDER — GADOBENATE DIMEGLUMINE 529 MG/ML IV SOLN
18.0000 mL | Freq: Once | INTRAVENOUS | Status: AC | PRN
  Administered 2018-07-10: 18 mL via INTRAVENOUS

## 2018-07-12 ENCOUNTER — Telehealth: Payer: Self-pay | Admitting: Neurology

## 2018-07-12 NOTE — Telephone Encounter (Signed)
I called the patient, left message.  MRI of the brain and cervical spine do not show an etiology for her paresthesias of all 4 extremities.  Paresthesias come and go, likely are benign in nature.    MRI brain 07/12/18:  IMPRESSION: This is a normal MRI of the brain with and without contrast.   MRI cervical 07/12/18:  IMPRESSION:  This MRI of the cervical spine with and without shows the following: 1.    The spinal cord appears normal. 2.    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  There is no foraminal narrowing at any of these levels and no nerve root compression. 3.    There is a normal enhancement pattern.

## 2018-07-17 ENCOUNTER — Encounter: Payer: BLUE CROSS/BLUE SHIELD | Admitting: Obstetrics & Gynecology

## 2018-07-17 NOTE — Progress Notes (Deleted)
  Subjective:   Patient ID: Dana Pope    DOB: April 03, 1976, 43 y.o. female   MRN: [id number]  Chosen Dana Pope is a 43 y.o. female with a history of BPV, DUB, overweight, depression here for   BACK PAIN  Back pain began *** days ago. Pain is described as ***. Patient has tried ***. Pain radiates ***. History of trauma or injury: *** Patient believes might be causing their pain: ***  Prior history of similar pain: *** History of cancer: *** Weak immune system:  *** History of IV drug use: *** History of steroid use: ***  Symptoms Incontinence of bowel or bladder:  *** Numbness of leg: *** Fever: *** Rest or Night pain: *** Weight Loss:  **** Rash: ***  Review of Systems:  Per HPI.  PMFSH, medications and smoking status reviewed.  Objective:   There were no vitals taken for this visit. Vitals and nursing note reviewed.  General: well nourished, well developed, in no acute distress with non-toxic appearance HEENT: normocephalic, atraumatic, moist mucous membranes Neck: supple, non-tender without lymphadenopathy CV: regular rate and rhythm without murmurs, rubs, or gallops, no lower extremity edema Lungs: clear to auscultation bilaterally with normal work of breathing Abdomen: soft, non-tender, non-distended, no masses or organomegaly palpable, normoactive bowel sounds Skin: warm, dry, no rashes or lesions Extremities: warm and well perfused, normal tone MSK: ROM grossly intact, strength intact, gait normal Neuro: Alert and oriented, speech normal  Assessment & Plan:   No problem-specific Assessment & Plan notes found for this encounter.  No orders of the defined types were placed in this encounter.  No orders of the defined types were placed in this encounter.   Ellwood Dense, DO PGY-2, [facility] 07/17/2018 5:25 PM

## 2018-07-18 ENCOUNTER — Ambulatory Visit: Payer: BLUE CROSS/BLUE SHIELD

## 2018-09-10 DIAGNOSIS — M25511 Pain in right shoulder: Secondary | ICD-10-CM | POA: Diagnosis not present

## 2018-09-10 DIAGNOSIS — M542 Cervicalgia: Secondary | ICD-10-CM | POA: Diagnosis not present

## 2018-09-10 DIAGNOSIS — M50323 Other cervical disc degeneration at C6-C7 level: Secondary | ICD-10-CM | POA: Diagnosis not present

## 2018-09-19 DIAGNOSIS — M5412 Radiculopathy, cervical region: Secondary | ICD-10-CM | POA: Diagnosis not present

## 2018-09-24 ENCOUNTER — Other Ambulatory Visit: Payer: Self-pay

## 2018-09-25 ENCOUNTER — Encounter: Payer: Self-pay | Admitting: Obstetrics & Gynecology

## 2018-09-25 ENCOUNTER — Ambulatory Visit (INDEPENDENT_AMBULATORY_CARE_PROVIDER_SITE_OTHER): Payer: BLUE CROSS/BLUE SHIELD | Admitting: Obstetrics & Gynecology

## 2018-09-25 VITALS — BP 124/78 | Ht 63.0 in | Wt 181.0 lb

## 2018-09-25 DIAGNOSIS — Z01419 Encounter for gynecological examination (general) (routine) without abnormal findings: Secondary | ICD-10-CM | POA: Diagnosis not present

## 2018-09-25 DIAGNOSIS — E6609 Other obesity due to excess calories: Secondary | ICD-10-CM

## 2018-09-25 DIAGNOSIS — Z9851 Tubal ligation status: Secondary | ICD-10-CM | POA: Diagnosis not present

## 2018-09-25 DIAGNOSIS — Z6832 Body mass index (BMI) 32.0-32.9, adult: Secondary | ICD-10-CM

## 2018-09-25 NOTE — Progress Notes (Signed)
Dana Pope [birth year] [id number]   History:    43 y.o. G4P4L4 Married.  S/P TL.  RP:  Established patient presenting for annual gyn exam   HPI: Menstrual.'s every months with normal flow.  No breakthrough bleeding.  No pelvic pain.  No pain with intercourse.  Status post tubal ligation.  Urine and bowel movements normal.  Breasts normal.  Body mass index 32.06.  Gaining weight.  Not exercising regularly.  Health labs with family physician.  Past medical history,surgical history, family history and social history were all reviewed and documented in the EPIC chart.  Gynecologic History Patient's last menstrual period was 09/11/2018. Contraception: tubal ligation Last Pap: 03/2014. Results were: Negative/HPV HR neg Last mammogram: Needs to schedule first screening Mammo Bone Density: Never Colonoscopy: Never  Obstetric History OB History  Gravida Para Term Preterm AB Living  4 4       4   SAB TAB Ectopic Multiple Live Births               # Outcome Date GA Lbr Len/2nd Weight Sex Delivery Anes PTL Lv  4 Para           3 Para           2 Para           1 Para              ROS: A ROS was performed and pertinent positives and negatives are included in the history.  GENERAL: No fevers or chills. HEENT: No change in vision, no earache, sore throat or sinus congestion. NECK: No pain or stiffness. CARDIOVASCULAR: No chest pain or pressure. No palpitations. PULMONARY: No shortness of breath, cough or wheeze. GASTROINTESTINAL: No abdominal pain, nausea, vomiting or diarrhea, melena or bright red blood per rectum. GENITOURINARY: No urinary frequency, urgency, hesitancy or dysuria. MUSCULOSKELETAL: No joint or muscle pain, no back pain, no recent trauma. DERMATOLOGIC: No rash, no itching, no lesions. ENDOCRINE: No polyuria, polydipsia, no heat or cold intolerance. No recent change in weight. HEMATOLOGICAL: No anemia or easy bruising or bleeding. NEUROLOGIC: No headache, seizures,  numbness, tingling or weakness. PSYCHIATRIC: No depression, no loss of interest in normal activity or change in sleep pattern.     Exam:   BP 124/78   Ht 5\' 3"  (1.6 m)   Wt 181 lb (82.1 kg)   LMP 09/11/2018   BMI 32.06 kg/m   Body mass index is 32.06 kg/m.  General appearance : Well developed well nourished female. No acute distress HEENT: Eyes: no retinal hemorrhage or exudates,  Neck supple, trachea midline, no carotid bruits, no thyroidmegaly Lungs: Clear to auscultation, no rhonchi or wheezes, or rib retractions  Heart: Regular rate and rhythm, no murmurs or gallops Breast:Examined in sitting and supine position were symmetrical in appearance, no palpable masses or tenderness,  no skin retraction, no nipple inversion, no nipple discharge, no skin discoloration, no axillary or supraclavicular lymphadenopathy Abdomen: no palpable masses or tenderness, no rebound or guarding Extremities: no edema or skin discoloration or tenderness  Pelvic: Vulva: Normal             Vagina: No gross lesions or discharge  Cervix: No gross lesions or discharge.  Pap reflex done.  Uterus  AV, normal size, shape and consistency, non-tender and mobile  Adnexa  Without masses or tenderness  Anus: Normal   Assessment/Plan:  43 y.o. female for annual exam   1. Encounter for routine gynecological examination with Papanicolaou  smear of cervix Normal gynecologic exam.  Pap reflex done.  Breast exam normal.  Schedule screening mammogram at the breast center now.  Health labs with family physician.  2. S/P tubal ligation  3. Class 1 obesity due to excess calories without serious comorbidity with body mass index (BMI) of 32.0 to 32.9 in adult Gaining weight since last annual gynecologic exam.  Will rule out thyroid disease.  Will do a vitamin D level.  Recommend a lower calorie/carb diet such as [employer].  Intermittent fasting discussed.  Recommend aerobic physical activities 5 times a week and  weightlifting every 2 days. - TSH - VITAMIN D 25 Hydroxy (Vit-D Deficiency, Fractures)  Other orders - traMADol (ULTRAM) 50 MG tablet; Take by mouth every 6 (six) hours as needed. - methocarbamol (ROBAXIN) 500 MG tablet; Take 500 mg by mouth 4 (four) times daily.  Genia Del MD, 1:43 PM 09/25/2018

## 2018-09-26 ENCOUNTER — Encounter: Payer: Self-pay | Admitting: Obstetrics & Gynecology

## 2018-09-26 LAB — PAP IG W/ RFLX HPV ASCU

## 2018-09-26 LAB — TSH: TSH: 1.05 m[IU]/L

## 2018-09-26 LAB — VITAMIN D 25 HYDROXY (VIT D DEFICIENCY, FRACTURES): Vit D, 25-Hydroxy: 29 ng/mL — ABNORMAL LOW (ref 30–100)

## 2018-09-26 NOTE — Patient Instructions (Signed)
1. Encounter for routine gynecological examination with Papanicolaou smear of cervix Normal gynecologic exam.  Pap reflex done.  Breast exam normal.  Schedule screening mammogram at the breast center now.  Health labs with family physician.  2. S/P tubal ligation  3. Class 1 obesity due to excess calories without serious comorbidity with body mass index (BMI) of 32.0 to 32.9 in adult Gaining weight since last annual gynecologic exam.  Will rule out thyroid disease.  Will do a vitamin D level.  Recommend a lower calorie/carb diet such as [employer].  Intermittent fasting discussed.  Recommend aerobic physical activities 5 times a week and weightlifting every 2 days. - TSH - VITAMIN D 25 Hydroxy (Vit-D Deficiency, Fractures)  Other orders - traMADol (ULTRAM) 50 MG tablet; Take by mouth every 6 (six) hours as needed. - methocarbamol (ROBAXIN) 500 MG tablet; Take 500 mg by mouth 4 (four) times daily.  Candita, it was a pleasure meeting you today!  I will inform you of your results as soon as they are available.

## 2018-10-03 DIAGNOSIS — M25511 Pain in right shoulder: Secondary | ICD-10-CM | POA: Diagnosis not present

## 2018-10-03 DIAGNOSIS — M542 Cervicalgia: Secondary | ICD-10-CM | POA: Diagnosis not present

## 2018-10-03 DIAGNOSIS — M5412 Radiculopathy, cervical region: Secondary | ICD-10-CM | POA: Diagnosis not present

## 2018-10-04 DIAGNOSIS — M5412 Radiculopathy, cervical region: Secondary | ICD-10-CM | POA: Diagnosis not present

## 2018-10-07 ENCOUNTER — Ambulatory Visit: Payer: BLUE CROSS/BLUE SHIELD | Admitting: Neurology

## 2018-11-07 DIAGNOSIS — G894 Chronic pain syndrome: Secondary | ICD-10-CM | POA: Diagnosis not present

## 2018-11-07 DIAGNOSIS — M5030 Other cervical disc degeneration, unspecified cervical region: Secondary | ICD-10-CM | POA: Diagnosis not present

## 2019-01-24 ENCOUNTER — Emergency Department (HOSPITAL_COMMUNITY): Payer: BC Managed Care – PPO

## 2019-01-24 ENCOUNTER — Other Ambulatory Visit: Payer: Self-pay

## 2019-01-24 ENCOUNTER — Encounter (HOSPITAL_COMMUNITY): Payer: Self-pay | Admitting: Emergency Medicine

## 2019-01-24 ENCOUNTER — Emergency Department (HOSPITAL_COMMUNITY)
Admission: EM | Admit: 2019-01-24 | Discharge: 2019-01-24 | Disposition: A | Discharge status: Home / Self Care | Payer: BC Managed Care – PPO | Attending: Emergency Medicine | Admitting: Emergency Medicine

## 2019-01-24 DIAGNOSIS — R000 Tachycardia, unspecified: Secondary | ICD-10-CM | POA: Diagnosis not present

## 2019-01-24 DIAGNOSIS — R1013 Epigastric pain: Secondary | ICD-10-CM | POA: Diagnosis not present

## 2019-01-24 DIAGNOSIS — S6992XA Unspecified injury of left wrist, hand and finger(s), initial encounter: Secondary | ICD-10-CM | POA: Diagnosis not present

## 2019-01-24 DIAGNOSIS — S59902A Unspecified injury of left elbow, initial encounter: Secondary | ICD-10-CM | POA: Diagnosis not present

## 2019-01-24 DIAGNOSIS — Y93I9 Activity, other involving external motion: Secondary | ICD-10-CM | POA: Insufficient documentation

## 2019-01-24 DIAGNOSIS — S299XXA Unspecified injury of thorax, initial encounter: Secondary | ICD-10-CM | POA: Diagnosis not present

## 2019-01-24 DIAGNOSIS — S0081XA Abrasion of other part of head, initial encounter: Secondary | ICD-10-CM | POA: Diagnosis not present

## 2019-01-24 DIAGNOSIS — R079 Chest pain, unspecified: Secondary | ICD-10-CM | POA: Diagnosis not present

## 2019-01-24 DIAGNOSIS — Y92410 Unspecified street and highway as the place of occurrence of the external cause: Secondary | ICD-10-CM | POA: Insufficient documentation

## 2019-01-24 DIAGNOSIS — R0789 Other chest pain: Secondary | ICD-10-CM | POA: Diagnosis not present

## 2019-01-24 DIAGNOSIS — T07XXXA Unspecified multiple injuries, initial encounter: Secondary | ICD-10-CM

## 2019-01-24 DIAGNOSIS — S3991XA Unspecified injury of abdomen, initial encounter: Secondary | ICD-10-CM | POA: Diagnosis not present

## 2019-01-24 DIAGNOSIS — M7989 Other specified soft tissue disorders: Secondary | ICD-10-CM | POA: Diagnosis not present

## 2019-01-24 DIAGNOSIS — R58 Hemorrhage, not elsewhere classified: Secondary | ICD-10-CM | POA: Diagnosis not present

## 2019-01-24 DIAGNOSIS — S0093XA Contusion of unspecified part of head, initial encounter: Secondary | ICD-10-CM | POA: Diagnosis not present

## 2019-01-24 DIAGNOSIS — M25532 Pain in left wrist: Secondary | ICD-10-CM | POA: Diagnosis not present

## 2019-01-24 DIAGNOSIS — S81011A Laceration without foreign body, right knee, initial encounter: Secondary | ICD-10-CM | POA: Diagnosis not present

## 2019-01-24 DIAGNOSIS — S0990XA Unspecified injury of head, initial encounter: Secondary | ICD-10-CM | POA: Diagnosis present

## 2019-01-24 DIAGNOSIS — S3993XA Unspecified injury of pelvis, initial encounter: Secondary | ICD-10-CM | POA: Diagnosis not present

## 2019-01-24 DIAGNOSIS — R072 Precordial pain: Secondary | ICD-10-CM | POA: Diagnosis not present

## 2019-01-24 DIAGNOSIS — M25522 Pain in left elbow: Secondary | ICD-10-CM | POA: Diagnosis not present

## 2019-01-24 DIAGNOSIS — Y999 Unspecified external cause status: Secondary | ICD-10-CM | POA: Diagnosis not present

## 2019-01-24 DIAGNOSIS — S8011XA Contusion of right lower leg, initial encounter: Secondary | ICD-10-CM | POA: Diagnosis not present

## 2019-01-24 DIAGNOSIS — S199XXA Unspecified injury of neck, initial encounter: Secondary | ICD-10-CM | POA: Diagnosis not present

## 2019-01-24 DIAGNOSIS — S60212A Contusion of left wrist, initial encounter: Secondary | ICD-10-CM | POA: Diagnosis not present

## 2019-01-24 DIAGNOSIS — S1093XA Contusion of unspecified part of neck, initial encounter: Secondary | ICD-10-CM | POA: Diagnosis not present

## 2019-01-24 LAB — CBC
HCT: 41.2 % (ref 36.0–46.0)
Hemoglobin: 13.5 g/dL (ref 12.0–15.0)
MCH: 29.6 pg (ref 26.0–34.0)
MCHC: 32.8 g/dL (ref 30.0–36.0)
MCV: 90.4 fL (ref 80.0–100.0)
Platelets: 244 10*3/uL (ref 150–400)
RBC: 4.56 MIL/uL (ref 3.87–5.11)
RDW: 12.1 % (ref 11.5–15.5)
WBC: 6 10*3/uL (ref 4.0–10.5)
nRBC: 0 % (ref 0.0–0.2)

## 2019-01-24 LAB — COMPREHENSIVE METABOLIC PANEL
ALT: 17 U/L (ref 0–44)
AST: 23 U/L (ref 15–41)
Albumin: 3.8 g/dL (ref 3.5–5.0)
Alkaline Phosphatase: 59 U/L (ref 38–126)
Anion gap: 10 (ref 5–15)
BUN: 11 mg/dL (ref 6–20)
CO2: 22 mmol/L (ref 22–32)
Calcium: 9.1 mg/dL (ref 8.9–10.3)
Chloride: 106 mmol/L (ref 98–111)
Creatinine, Ser: 0.64 mg/dL (ref 0.44–1.00)
GFR calc Af Amer: >60 mL/min (ref >60)
GFR calc non Af Amer: >60 mL/min (ref >60)
Glucose, Bld: 97 mg/dL (ref 70–99)
Potassium: 3.9 mmol/L (ref 3.5–5.1)
Sodium: 138 mmol/L (ref 135–145)
Total Bilirubin: 0.5 mg/dL (ref 0.3–1.2)
Total Protein: 7 g/dL (ref 6.5–8.1)

## 2019-01-24 LAB — I-STAT CHEM 8, ED
BUN: 13 mg/dL (ref 6–20)
Calcium, Ion: 1.14 mmol/L — ABNORMAL LOW (ref 1.15–1.40)
Chloride: 106 mmol/L (ref 98–111)
Creatinine, Ser: 0.6 mg/dL (ref 0.44–1.00)
Glucose, Bld: 96 mg/dL (ref 70–99)
HCT: 41 % (ref 36.0–46.0)
Hemoglobin: 13.9 g/dL (ref 12.0–15.0)
Potassium: 3.8 mmol/L (ref 3.5–5.1)
Sodium: 138 mmol/L (ref 135–145)
TCO2: 25 mmol/L (ref 22–32)

## 2019-01-24 LAB — I-STAT BETA HCG BLOOD, ED (MC, WL, AP ONLY): I-stat hCG, quantitative: <5 m[IU]/mL (ref <5)

## 2019-01-24 MED ORDER — IOHEXOL 300 MG/ML  SOLN
100.0000 mL | Freq: Once | INTRAMUSCULAR | Status: AC | PRN
  Administered 2019-01-24: 08:00:00 100 mL via INTRAVENOUS

## 2019-01-24 MED ORDER — SODIUM CHLORIDE 0.9 % IV SOLN: INTRAVENOUS | Status: DC

## 2019-01-24 MED ORDER — KETOROLAC TROMETHAMINE 15 MG/ML IJ SOLN
15.0000 mg | Freq: Once | INTRAMUSCULAR | Status: AC
  Administered 2019-01-24: 15 mg via INTRAVENOUS
  Filled 2019-01-24: qty 1

## 2019-01-24 MED ORDER — SODIUM CHLORIDE 0.9 % IV BOLUS
1000.0000 mL | Freq: Once | INTRAVENOUS | Status: AC
  Administered 2019-01-24: 1000 mL via INTRAVENOUS

## 2019-01-24 MED ORDER — MORPHINE SULFATE (PF) 4 MG/ML IV SOLN
4.0000 mg | Freq: Once | INTRAVENOUS | Status: AC
  Administered 2019-01-24: 4 mg via INTRAVENOUS
  Filled 2019-01-24: qty 1

## 2019-01-24 NOTE — ED Notes (Signed)
C-collar removed per Dr. Wilson Singer ok. Pt given scrubs to leave in. Pt verbalized understanding of discharge instructions and follow up care. Interpreter used. Pt utilized wheelchair to be taken to lobby. Pt alert and oriented X4. IV removed and bleeding controlled.

## 2019-01-24 NOTE — Discharge Instructions (Signed)
Your CT scans and x-rays do not show any serious injuries. The x-rays of your wrist showed soft tissue swelling but no break/fracture. I expect you to continue to hurt for close to a week. Take 600 mg of ibuprofen every 6 hours as needed for pain.

## 2019-01-24 NOTE — ED Notes (Signed)
Patient transported to CT 
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
## 2019-01-28 ENCOUNTER — Other Ambulatory Visit: Payer: Self-pay

## 2019-01-28 ENCOUNTER — Ambulatory Visit (INDEPENDENT_AMBULATORY_CARE_PROVIDER_SITE_OTHER): Payer: BC Managed Care – PPO | Admitting: Family Medicine

## 2019-01-28 VITALS — BP 114/74 | HR 89 | Wt 188.5 lb

## 2019-01-28 DIAGNOSIS — M545 Low back pain, unspecified: Secondary | ICD-10-CM

## 2019-01-28 MED ORDER — IBUPROFEN 800 MG PO TABS: 800.0000 mg | ORAL_TABLET | Freq: Three times a day (TID) | ORAL | 0 refills | Status: DC | PRN

## 2019-01-28 MED ORDER — BACLOFEN 10 MG PO TABS: 10.0000 mg | ORAL_TABLET | Freq: Three times a day (TID) | ORAL | 0 refills | Status: DC
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
## 2019-02-10 ENCOUNTER — Other Ambulatory Visit: Payer: Self-pay

## 2019-02-10 ENCOUNTER — Ambulatory Visit (INDEPENDENT_AMBULATORY_CARE_PROVIDER_SITE_OTHER): Payer: BC Managed Care – PPO | Admitting: Family Medicine

## 2019-02-10 DIAGNOSIS — R51 Headache: Secondary | ICD-10-CM

## 2019-02-10 DIAGNOSIS — M549 Dorsalgia, unspecified: Secondary | ICD-10-CM

## 2019-02-10 DIAGNOSIS — G8929 Other chronic pain: Secondary | ICD-10-CM

## 2019-02-10 MED ORDER — BACLOFEN 10 MG PO TABS: 10.0000 mg | ORAL_TABLET | Freq: Three times a day (TID) | ORAL | 0 refills | Status: DC

## 2019-02-10 MED ORDER — ACETAMINOPHEN 500 MG PO TABS: 1000.0000 mg | ORAL_TABLET | Freq: Three times a day (TID) | ORAL | 0 refills | Status: DC

## 2019-02-10 NOTE — Patient Instructions (Addendum)
It may take another month or more for the pain to go away completely.  In the meantime I would like you to go back to alternating Tylenol and ibuprofen every 4 hours.  You can take the Tylenol as much as 1000 mg every 8 hours.  I would take no more than 400 mg of ibuprofen every 8 hours.  If this medicine does not control the pain you should use the muscle relaxer.  Also use the muscle relaxer before going to bed so that you do not get muscle spasms while you are sleeping.  You can take a wet washcloth run and under room temperature water and then ring it out and stick it in the microwave for 30 seconds to make it warm.  You can then put this on your neck and other areas of your back that hurt.  You can repeat this as much as you would like throughout the day.  If you still do not have improvement in your pain in 1 month please call us to schedule another appointment.  Also if your headaches are not getting better during this time, please call us to make another appointment.   Rehabilitacin de distensin y esguince cervical Cervical Strain and Sprain Rehab Pregunte al mdico qu ejercicios son seguros para usted. Haga los ejercicios exactamente como se lo haya indicado el mdico y gradelos como se lo hayan indicado. Es normal sentir un estiramiento leve, tironeo, opresin o Tree surgeon al [employer] Burton. Detngase de inmediato si siente un dolor repentino o Printmaker. No comience a hacer estos ejercicios hasta que se lo indique el mdico. Ejercicios de elongacin y amplitud de movimiento Inclinacin cervical hacia un lado  1. Sintese en una silla estable o pngase de pie y [place]. 2. Sin mover los hombros, incline lentamente el lado izquierdo/derecho de la cabeza y lleve la oreja hacia el hombro hasta sentir un estiramiento en los msculos del cuello en el lado opuesto. Mantenga la mirada al frente. 3. Mantenga esta posicin durante ____30___ segundos. 4. Repita el  ejercicio con el otro lado del cuello. Repita __4______ veces. Realice este ejercicio ___5____ veces al da. Rotacin cervical  1. Sintese en una silla estable o pngase de pie y [place]. 2. Lentamente, gire la cabeza hacia un lado, como si quisiera mirar por encima del hombro izquierdo/derecho. ? Mantenga la mirada paralela al suelo. ? Detngase cuando sienta un estiramiento en el costado y en la parte de atrs del cuello. 3. Mantenga esta posicin durante ___30_____ segundos. 4. Para repetir el ejercicio, gire hacia el otro lado. Repita ___4_____ veces. Realice este ejercicio __8______ veces al da. Extensin torcica y estiramiento pectoral 1. Use una toalla o una manta pequea para armar un rollo de 4pulgadas (10cm) de dimetro. 2. Acustese boca Erma Pinto sobre una superficie firme. 3. Coloque la toalla a lo largo, debajo de la columna en la parte media de la espalda. No debe estar debajo de los omplatos. La toalla debe estar alineada con la columna, desde la parte media hasta la parte baja de la espalda. 4. Coloque las manos detrs de la cabeza y deje caer los codos [place] lados. 5. Mantenga esta posicin durante __30______ segundos. Repita ____4____ veces. Realice este ejercicio ____8___ veces al da.  Extensin cervical, isomtrica  1. Prese frente a una pared, a una distancia aproximada de 6pulgadas (15cm), con la espalda contra la pared. 2. Coloque un objeto blando, de unas 6 a 8pulgadas (  15 a 20cm) de dimetro, entre la nuca y la pared. Puede ser Viona Gilmoreuna almohada pequea, una pelota o una toalla doblada. 3. Incline suavemente la cabeza hacia atrs y Colombiaejerza presin sobre el objeto blando. Mantenga relajadas la mandbula y la frente. 4. Mantenga esta posicin durante ____30____ segundos. 5. Afloje lentamente la tensin. Relaje los msculos por completo antes de repetir el ejercicio. Repita _____4___ veces. Realice este ejercicio ____3___ veces al da.  Postura y Advice workermecnica corporal La mecnica corporal se refiere a los movimientos y a las posiciones del cuerpo mientras realiza las actividades diarias. La postura es una parte de la Water quality scientistmecnica corporal. La buena postura y la Administrator, sportsmecnica corporal saludable pueden ayudar a Acupuncturistaliviar el estrs en las articulaciones y los tejidos del cuerpo. La buena postura significa que la columna mantiene su posicin natural de curvatura en forma de S (la columna est en una posicin neutral), los hombros Zenaida Niecevan un poco hacia atrs y la cabeza no se inclina hacia adelante. A continuacin, se incluyen pautas generales para mejorar la postura y Regulatory affairs officerla mecnica corporal en las actividades diarias. Sentado  1. Cuando est sentado, mantenga la columna en posicin neutral y deje los pies apoyados en el suelo. Use un apoyapis, si es necesario, y FedExmantenga los muslos paralelos al suelo. Evite redondear los hombros e inclinar la cabeza hacia adelante. 2. Cuando trabaje en un escritorio o con una computadora, el escritorio debe estar a una altura en la que las manos estn un poco ms abajo que los codos. Deslice la silla debajo del escritorio, de modo de estar lo suficientemente cerca como para mantener una buena [place]postura. 3. Cuando trabaje con una computadora, coloque el monitor a una altura que le permita mirar derecho hacia adelante, sin tener que inclinar la cabeza hacia adelante o Belle[place]hacia atrs. De pie   Cuando est de pie, mantenga la columna en posicin neutral y los pies separados aproximadamente al ancho de caderas. Mantenga las rodillas ligeramente flexionadas. [place]orejas, los hombros y las caderas deben estar alineados.  Cuando realice una tarea en la que deba estar de pie en el mismo sitio durante mucho tiempo, coloque un pie en un objeto estable de 2 a 4pulgadas (5a 10cm) de alto, como un taburete. Esto ayuda a que la columna Baxter Internationalmantenga una posicin neutral. Reposo Al descansar o estar acostado, evite las posiciones que le causen ms  dolor. Intente sostener el cuello en una posicin neutral. Puede usar una almohada anatmica o una toalla pequea enrollada. La almohada debe sostenerle el cuello, pero no empujarlo. Esta informacin no tiene Theme park managercomo fin reemplazar el consejo del mdico. Asegrese de hacerle al mdico cualquier pregunta que tenga. Document Released: 04/05/2006 Document Revised: 05/06/2018 Document Reviewed: 05/06/2018 Elsevier Patient Education  2020 [employer]
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
## 2019-02-14 ENCOUNTER — Encounter (HOSPITAL_COMMUNITY): Payer: Self-pay | Admitting: *Deleted

## 2019-02-14 ENCOUNTER — Emergency Department (HOSPITAL_COMMUNITY)
Admission: EM | Admit: 2019-02-14 | Discharge: 2019-02-15 | Disposition: A | Discharge status: Home / Self Care | Payer: BC Managed Care – PPO | Attending: Emergency Medicine | Admitting: Emergency Medicine

## 2019-02-14 ENCOUNTER — Other Ambulatory Visit: Payer: Self-pay

## 2019-02-14 DIAGNOSIS — M549 Dorsalgia, unspecified: Secondary | ICD-10-CM | POA: Insufficient documentation

## 2019-02-14 DIAGNOSIS — Z5321 Procedure and treatment not carried out due to patient leaving prior to being seen by health care provider: Secondary | ICD-10-CM | POA: Insufficient documentation

## 2019-02-14 DIAGNOSIS — M545 Low back pain: Secondary | ICD-10-CM | POA: Diagnosis not present
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
## 2019-02-15 ENCOUNTER — Other Ambulatory Visit: Payer: Self-pay

## 2019-02-15 ENCOUNTER — Emergency Department (HOSPITAL_COMMUNITY)
Admission: EM | Admit: 2019-02-15 | Discharge: 2019-02-15 | Disposition: A | Discharge status: Home / Self Care | Payer: BC Managed Care – PPO | Source: Home / Self Care | Attending: Emergency Medicine | Admitting: Emergency Medicine

## 2019-02-15 ENCOUNTER — Encounter (HOSPITAL_COMMUNITY): Payer: Self-pay

## 2019-02-15 DIAGNOSIS — M545 Low back pain, unspecified: Secondary | ICD-10-CM

## 2019-02-15 DIAGNOSIS — Z79899 Other long term (current) drug therapy: Secondary | ICD-10-CM | POA: Insufficient documentation

## 2019-02-15 MED ORDER — KETOROLAC TROMETHAMINE 30 MG/ML IJ SOLN
30.0000 mg | Freq: Once | INTRAMUSCULAR | Status: AC
  Administered 2019-02-15: 30 mg via INTRAMUSCULAR
  Filled 2019-02-15: qty 1

## 2019-02-15 MED ORDER — LIDOCAINE 5 % EX PTCH
1.0000 | MEDICATED_PATCH | Freq: Once | CUTANEOUS | Status: DC
  Administered 2019-02-15: 1 via TRANSDERMAL
  Filled 2019-02-15: qty 1

## 2019-02-15 MED ORDER — LIDOCAINE 5 % EX PTCH: 1.0000 | MEDICATED_PATCH | CUTANEOUS | 0 refills | Status: DC

## 2019-02-15 NOTE — ED Notes (Signed)
No answer for vitals recheck x2 

## 2019-02-15 NOTE — ED Notes (Signed)
No answer for vitals recheck x1 

## 2019-02-15 NOTE — ED Notes (Signed)
Dana Pope, [place], [place] interpreter
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
## 2019-02-15 NOTE — ED Notes (Signed)
No answer for vitals recheck x3 
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
## 2019-02-15 NOTE — ED Notes (Signed)
Patient verbalizes understanding of discharge instructions. Opportunity for questioning and answers were provided. Armband removed by staff, pt discharged from ED.  

## 2019-02-15 NOTE — Discharge Instructions (Signed)
Your back pain should be treated with medicines such as ibuprofen or tylenol and this back pain should get better over the next 2 weeks.   -I have included information for sports medicine doctor Dr. Marcelino Scot.  You can call the follow-up appointment if needed. -Also recommend you follow-up with your primary care doctor.  I know you have done this once already, it is possible that you need physical therapy and the doctors can refer you for that.  Follow Up: Please follow up with your primary healthcare provider in 1-2 weeks for reassessment. if you do not have a primary care doctor use the resource guide provided to find one.  Low back pain is discomfort in the lower back that may be due to injuries to muscles and ligaments around the spine. Occasionally, it may be caused by a a problem to a part of the spine called a disc. The pain may last several days or a week;  However, most patients get completely well in 4 weeks.   1. Medications: Alternate 600 mg of ibuprofen and [phone] mg of Tylenol every 3 hours as needed for pain. Do not exceed 4000 mg of Tylenol daily.  Take ibuprofen with food to avoid upset stomach issues.   Muscle relaxants:  These medications can help with muscle tightness that is a cause of lower back pain. Most of these medications can cause drowsiness, and it is not safe to drive or use dangerous machinery while taking them.You can take Flexeril as needed for muscle spasm up to twice daily but do not drive, drink alcohol, or operate heavy machinery while taking this medicine because it may make you drowsy.  I typically recommend taking this medicine only at night when you are going to sleep.  You can also cut these tablets in half if they make you feel very drowsy.  2. Treatment: rest, drink plenty of fluids, gentle stretching as discussed (see attached), alternate ice and heat (or stick with whichever feels best) 20 minutes on 20 minutes off. Maintaining your daily activities, including  walking, is encourged, as it will help you get better faster than just staying in bed.    Be aware that if you develop new symptoms, such as a fever, leg weakness, difficulty with or loss of control of your urine or bowels, abdominal pain, or more severe pain, you will need to seek medical attention immediately and  / or return to the Emergency department.

## 2019-02-18 DIAGNOSIS — M533 Sacrococcygeal disorders, not elsewhere classified: Secondary | ICD-10-CM | POA: Diagnosis not present

## 2019-02-18 DIAGNOSIS — M545 Low back pain: Secondary | ICD-10-CM | POA: Diagnosis not present

## 2019-04-07 ENCOUNTER — Other Ambulatory Visit (HOSPITAL_COMMUNITY): Payer: Self-pay | Admitting: Exclusive Provider Organization

## 2019-04-07 ENCOUNTER — Other Ambulatory Visit (HOSPITAL_COMMUNITY): Payer: Self-pay

## 2019-04-07 ENCOUNTER — Ambulatory Visit
Admission: RE | Admit: 2019-04-07 | Discharge: 2019-04-07 | Disposition: A | Discharge status: Home / Self Care | Payer: Self-pay | Source: Ambulatory Visit | Attending: Thoracic Surgery (Cardiothoracic Vascular Surgery) | Admitting: Thoracic Surgery (Cardiothoracic Vascular Surgery)

## 2019-04-07 DIAGNOSIS — R52 Pain, unspecified: Secondary | ICD-10-CM

## 2019-04-15 ENCOUNTER — Other Ambulatory Visit: Payer: Self-pay

## 2019-04-15 ENCOUNTER — Ambulatory Visit: Payer: BC Managed Care – PPO | Admitting: Obstetrics & Gynecology

## 2019-04-15 ENCOUNTER — Encounter: Payer: Self-pay | Admitting: Obstetrics & Gynecology

## 2019-04-15 VITALS — BP 126/84

## 2019-04-15 DIAGNOSIS — R300 Dysuria: Secondary | ICD-10-CM | POA: Diagnosis not present

## 2019-04-15 DIAGNOSIS — R350 Frequency of micturition: Secondary | ICD-10-CM

## 2019-04-15 MED ORDER — SULFAMETHOXAZOLE-TRIMETHOPRIM 800-160 MG PO TABS: 1.0000 | ORAL_TABLET | Freq: Two times a day (BID) | ORAL | 0 refills | Status: AC

## 2019-04-15 NOTE — Progress Notes (Signed)
    Dana Pope [birth year] [id number]        43 y.o.  G4P4L4  Married.  S/P TL.  RP: Dysuria, frequency and urgency x a few days  HPI: Complains of burning with micturition and frequency of urination, as well as some urgency for a few days.  No mid back pain or flank pain.  No blood seen in urine.  No fever.  Patient has regular menstrual periods with no pelvic pain.  No abnormal vaginal discharge.   OB History  Gravida Para Term Preterm AB Living  4 4       4   SAB TAB Ectopic Multiple Live Births               # Outcome Date GA Lbr Len/2nd Weight Sex Delivery Anes PTL Lv  4 Para           3 Para           2 Para           1 Para             Past medical history,surgical history, problem list, medications, allergies, family history and social history were all reviewed and documented in the EPIC chart.   Directed ROS with pertinent positives and negatives documented in the history of present illness/assessment and plan.  Exam:  Vitals:   04/15/19 1527  BP: 126/84   General appearance:  Normal  Abdomen: Normal  CVAT negative bilaterally  Gynecologic exam: Vulva normal.  Speculum:  Cervix/Vagina normal.  Normal vaginal secretions.  U/A: Yellow cloudy, protein negative, nitrites negative, white blood cells 6-10, red blood cells 0-2, few bacteria.  Urine culture pending.   Assessment/Plan:  43 y.o. G4P4   1. Dysuria Probable acute cystitis per clinical symptoms and urine analysis.  No allergy to Bactrim.  Decision to start on Bactrim DS 1 tablet twice a day for 3 days.  Usage, risks and benefits reviewed.  Prescription sent to pharmacy.  Pending urine culture. - Urinalysis,Complete w/RFL Culture  2. Urinary frequency As above.  Other orders - sulfamethoxazole-trimethoprim (BACTRIM DS) 800-160 MG tablet; Take 1 tablet by mouth 2 (two) times daily for 3 days.  Counseling on above issues and coordination of care more than 50% for 15 minutes.  [name] MD, 3:38 PM 04/15/2019

## 2019-04-17 LAB — URINALYSIS, COMPLETE W/RFL CULTURE
Bilirubin Urine: NEGATIVE
Glucose, UA: NEGATIVE
Hyaline Cast: NONE SEEN /LPF
Ketones, ur: NEGATIVE
Nitrites, Initial: NEGATIVE
Protein, ur: NEGATIVE
Specific Gravity, Urine: 1.002 (ref 1.001–1.03)
pH: 6 (ref 5.0–8.0)

## 2019-04-17 LAB — CULTURE INDICATED

## 2019-04-17 LAB — URINE CULTURE
MICRO NUMBER:: 983950
SPECIMEN QUALITY:: ADEQUATE

## 2019-04-23 ENCOUNTER — Encounter: Payer: Self-pay | Admitting: Obstetrics & Gynecology

## 2019-04-23 NOTE — Patient Instructions (Signed)
1. Dysuria Probable acute cystitis per clinical symptoms and urine analysis.  No allergy to Bactrim.  Decision to start on Bactrim DS 1 tablet twice a day for 3 days.  Usage, risks and benefits reviewed.  Prescription sent to pharmacy.  Pending urine culture. - Urinalysis,Complete w/RFL Culture  2. Urinary frequency As above.  Other orders - sulfamethoxazole-trimethoprim (BACTRIM DS) 800-160 MG tablet; Take 1 tablet by mouth 2 (two) times daily for 3 days.  Dana Pope, it was a pleasure seeing you today!  I will inform you of your results as soon as they are available.

## 2019-05-23 ENCOUNTER — Other Ambulatory Visit: Payer: Self-pay

## 2019-05-26 ENCOUNTER — Other Ambulatory Visit: Payer: Self-pay

## 2019-05-26 ENCOUNTER — Ambulatory Visit: Payer: BC Managed Care – PPO | Admitting: Obstetrics & Gynecology

## 2019-05-26 ENCOUNTER — Encounter: Payer: Self-pay | Admitting: Obstetrics & Gynecology

## 2019-05-26 VITALS — BP 128/84

## 2019-05-26 DIAGNOSIS — E6609 Other obesity due to excess calories: Secondary | ICD-10-CM

## 2019-05-26 DIAGNOSIS — R6882 Decreased libido: Secondary | ICD-10-CM

## 2019-05-26 DIAGNOSIS — R300 Dysuria: Secondary | ICD-10-CM

## 2019-05-26 DIAGNOSIS — R102 Pelvic and perineal pain: Secondary | ICD-10-CM

## 2019-05-26 DIAGNOSIS — Z6832 Body mass index (BMI) 32.0-32.9, adult: Secondary | ICD-10-CM

## 2019-05-26 LAB — WET PREP FOR TRICH, YEAST, CLUE

## 2019-05-26 NOTE — Patient Instructions (Signed)
1. Dysuria Urine analysis very mildly disturbed, will wait on urine culture to decide if treatment is needed.  Wet prep negative. - Urinalysis,Complete w/RFL Culture - WET PREP FOR TRICH, YEAST, CLUE  2. Pelvic pain in female Complains of mild intermittent but frequent suprapubic pain.  Decision to complete the investigation with a pelvic ultrasound at follow-up. - US Transvaginal Non-OB; Future - WET PREP FOR [place], YEAST, CLUE  3. Low libido Low libido in the premenopausal period.  No pain with intercourse.  Mild occasional dryness, recommend coconut oil with intercourse.  Counseling on low libido done.  4. Class 1 obesity due to excess calories without serious comorbidity with body mass index (BMI) of 32.0 to 32.9 in adult Recommend a lower calorie/carb diet such as [employer].  Increase physical activities with aerobic activities 5 times a week and small weightlifting every 2 days.  Other orders - Multiple Vitamin (MULTIVITAMIN) tablet; Take 1 tablet by mouth daily. - cholecalciferol (VITAMIN D3) 25 MCG (1000 UT) tablet; Take 1,000 Units by mouth daily.  Billy, fue un placer verle hoy!  Voy a informarle de sus [employer].
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
## 2019-05-28 LAB — URINALYSIS, COMPLETE W/RFL CULTURE
Bilirubin Urine: NEGATIVE
Glucose, UA: NEGATIVE
Hyaline Cast: NONE SEEN /LPF
Ketones, ur: NEGATIVE
Leukocyte Esterase: NEGATIVE
Nitrites, Initial: NEGATIVE
Protein, ur: NEGATIVE
Specific Gravity, Urine: 1.02 (ref 1.001–1.03)
pH: 6.5 (ref 5.0–8.0)

## 2019-05-28 LAB — URINE CULTURE
MICRO NUMBER:: 1129464
Result:: NO GROWTH
SPECIMEN QUALITY:: ADEQUATE

## 2019-05-28 LAB — CULTURE INDICATED

## 2019-06-11 ENCOUNTER — Other Ambulatory Visit: Payer: Self-pay | Admitting: Obstetrics & Gynecology

## 2019-06-11 DIAGNOSIS — Z1231 Encounter for screening mammogram for malignant neoplasm of breast: Secondary | ICD-10-CM

## 2019-06-12 ENCOUNTER — Other Ambulatory Visit: Payer: Self-pay

## 2019-06-12 ENCOUNTER — Encounter: Payer: Self-pay | Admitting: Obstetrics & Gynecology

## 2019-06-12 ENCOUNTER — Ambulatory Visit: Payer: BC Managed Care – PPO | Admitting: Obstetrics & Gynecology

## 2019-06-12 ENCOUNTER — Ambulatory Visit (INDEPENDENT_AMBULATORY_CARE_PROVIDER_SITE_OTHER): Payer: BC Managed Care – PPO

## 2019-06-12 VITALS — BP 128/84

## 2019-06-12 DIAGNOSIS — M5442 Lumbago with sciatica, left side: Secondary | ICD-10-CM

## 2019-06-12 DIAGNOSIS — G8929 Other chronic pain: Secondary | ICD-10-CM

## 2019-06-12 DIAGNOSIS — R102 Pelvic and perineal pain: Secondary | ICD-10-CM

## 2019-06-12 NOTE — Patient Instructions (Signed)
1. Pelvic pain in female Midline lower abdominal pains probably associated with intestinal gas.  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  Pelvic ultrasound findings thoroughly reviewed with patient, the uterus, endometrial lining and ovaries are completely normal, patient is reassured.  2. Chronic midline low back pain with left-sided sciatica Refer to a new Orthopedist.  Rotha, fue un placer verle hoy!

## 2019-06-12 NOTE — Progress Notes (Signed)
    Dana Pope [birth year] [id number]        43 y.o.  G4P4L4  RP: Midline lower abdominal pain for Pelvic US  HPI: Patient seen for midline lower abdominal pain on May 26, 2019.  Urine culture at that time was negative.  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  Her lower abdominal discomfort is worse after a day of work with a bloated sensation.  Patient thinks that she is not passing her intestinal gas well.  Also has lower back pains with a radiation to the left leg.  Would like a second opinion with a different orthopedist.   OB History  Gravida Para Term Preterm AB Living  4 4       4   SAB TAB Ectopic Multiple Live Births               # Outcome Date GA Lbr Len/2nd Weight Sex Delivery Anes PTL Lv  4 Para           3 Para           2 Para           1 Para             Past medical history,surgical history, problem list, medications, allergies, family history and social history were all reviewed and documented in the EPIC chart.   Directed ROS with pertinent positives and negatives documented in the history of present illness/assessment and plan.  Exam:  Vitals:   06/12/19 1524  BP: 128/84   General appearance:  Normal  Pelvic US today: T/V images.  Anteverted uterus normal in size and shape with no myometrial mass measured at 7.87 x 6.04 x 5 cm.  The endometrial lining is symmetrical, sec. with no mass or thickening seen measured at 9.07 mm.  Both ovaries are normal in size with normal follicular pattern.  Both ovaries are mobile with normal perfusion.  No adnexal mass.  No free fluid in the posterior cul-de-sac.   Assessment/Plan:  43 y.o. G4P4   1. Pelvic pain in female Midline lower abdominal pains probably associated with intestinal gas.  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  Pelvic ultrasound findings thoroughly reviewed with patient, the uterus, endometrial lining and ovaries are completely normal, patient is  reassured.  2. Chronic midline low back pain with left-sided sciatica Refer to a new Orthopedist.  Counseling on above issues and coordination of care more than 50% for 15 minutes.  [name] MD, 3:41 PM 06/12/2019

## 2019-06-13 ENCOUNTER — Telehealth: Payer: Self-pay | Admitting: *Deleted

## 2019-06-13 NOTE — Telephone Encounter (Signed)
-----   Message from [name], MD sent at 06/12/2019  3:50 PM EST ----- Regarding: Refer to a new Orthopedist Lumbar pain with sciatica to the left leg.  Please refer to [employer].

## 2019-06-13 NOTE — Telephone Encounter (Signed)
Patient informed. 

## 2019-06-13 NOTE — Telephone Encounter (Signed)
Patient scheduled on 06/18/19 @ 1:30pm with Dr. Rip Harbour at [facility], needs to wear face mask and bring a interpreter with her to appointment.  Office notes faxed to [phone].   Will route to [place] to relay in [place]

## 2019-06-18 DIAGNOSIS — M25551 Pain in right hip: Secondary | ICD-10-CM | POA: Diagnosis not present

## 2019-07-10 DIAGNOSIS — J069 Acute upper respiratory infection, unspecified: Secondary | ICD-10-CM | POA: Diagnosis not present

## 2019-08-01 ENCOUNTER — Ambulatory Visit
Admission: RE | Admit: 2019-08-01 | Discharge: 2019-08-01 | Disposition: A | Discharge status: Home / Self Care | Payer: BC Managed Care – PPO | Source: Ambulatory Visit | Attending: Obstetrics & Gynecology | Admitting: Obstetrics & Gynecology

## 2019-08-01 ENCOUNTER — Other Ambulatory Visit: Payer: Self-pay

## 2019-08-01 DIAGNOSIS — Z1231 Encounter for screening mammogram for malignant neoplasm of breast: Secondary | ICD-10-CM

## 2019-08-04 ENCOUNTER — Other Ambulatory Visit: Payer: Self-pay | Admitting: Obstetrics & Gynecology

## 2019-08-04 DIAGNOSIS — R928 Other abnormal and inconclusive findings on diagnostic imaging of breast: Secondary | ICD-10-CM

## 2019-08-13 ENCOUNTER — Other Ambulatory Visit (HOSPITAL_COMMUNITY): Payer: Self-pay

## 2019-08-13 ENCOUNTER — Ambulatory Visit
Admission: RE | Admit: 2019-08-13 | Discharge: 2019-08-13 | Disposition: A | Discharge status: Home / Self Care | Payer: Self-pay | Source: Ambulatory Visit | Attending: Obstetrics & Gynecology | Admitting: Obstetrics & Gynecology

## 2019-08-13 ENCOUNTER — Other Ambulatory Visit: Payer: Self-pay

## 2019-08-13 ENCOUNTER — Ambulatory Visit
Admission: RE | Admit: 2019-08-13 | Discharge: 2019-08-13 | Disposition: A | Discharge status: Home / Self Care | Payer: Self-pay | Source: Ambulatory Visit | Attending: Thoracic Surgery (Cardiothoracic Vascular Surgery) | Admitting: Thoracic Surgery (Cardiothoracic Vascular Surgery)

## 2019-08-13 DIAGNOSIS — R921 Mammographic calcification found on diagnostic imaging of breast: Secondary | ICD-10-CM

## 2019-08-13 DIAGNOSIS — N6310 Unspecified lump in the right breast, unspecified quadrant: Secondary | ICD-10-CM

## 2019-08-13 DIAGNOSIS — N6001 Solitary cyst of right breast: Secondary | ICD-10-CM | POA: Diagnosis not present

## 2019-08-13 DIAGNOSIS — R928 Other abnormal and inconclusive findings on diagnostic imaging of breast: Secondary | ICD-10-CM

## 2019-08-13 DIAGNOSIS — R1031 Right lower quadrant pain: Secondary | ICD-10-CM | POA: Diagnosis not present

## 2019-08-25 DIAGNOSIS — S336XXD Sprain of sacroiliac joint, subsequent encounter: Secondary | ICD-10-CM | POA: Diagnosis not present

## 2019-08-31 ENCOUNTER — Other Ambulatory Visit: Payer: Self-pay

## 2019-08-31 ENCOUNTER — Ambulatory Visit (HOSPITAL_COMMUNITY)
Admission: EM | Admit: 2019-08-31 | Discharge: 2019-08-31 | Disposition: A | Discharge status: Home / Self Care | Payer: BC Managed Care – PPO | Attending: Urgent Care | Admitting: Urgent Care

## 2019-08-31 ENCOUNTER — Ambulatory Visit (INDEPENDENT_AMBULATORY_CARE_PROVIDER_SITE_OTHER): Payer: BC Managed Care – PPO

## 2019-08-31 ENCOUNTER — Encounter (HOSPITAL_COMMUNITY): Payer: Self-pay

## 2019-08-31 DIAGNOSIS — M546 Pain in thoracic spine: Secondary | ICD-10-CM

## 2019-08-31 DIAGNOSIS — K219 Gastro-esophageal reflux disease without esophagitis: Secondary | ICD-10-CM

## 2019-08-31 DIAGNOSIS — R0789 Other chest pain: Secondary | ICD-10-CM

## 2019-08-31 DIAGNOSIS — R079 Chest pain, unspecified: Secondary | ICD-10-CM | POA: Diagnosis not present

## 2019-08-31 MED ORDER — OMEPRAZOLE 20 MG PO CPDR: 20.0000 mg | DELAYED_RELEASE_CAPSULE | Freq: Every day | ORAL | 0 refills | Status: DC

## 2019-08-31 MED ORDER — FAMOTIDINE 20 MG PO TABS: 20.0000 mg | ORAL_TABLET | Freq: Two times a day (BID) | ORAL | 0 refills | Status: DC

## 2019-08-31 NOTE — ED Provider Notes (Signed)
[facility]   MRN: [id number] DOB: [birth year]  Subjective:   Dana Pope is a 44 y.o. female presenting for 1 month hx of persistent intermittent moderate chest pain that radiates toward her mid back. She has had exposure to COVID 19 in January, had loss of taste and smell, was negative for COVID 19. Sx have since resolved except for her chest pain. Has had intermittent subjective fever.  She was prescribed an albuterol inhaler but does not notice that it makes much of a difference.  She is not a smoker but states that when she tried to smoke once in the past month it made her symptoms worse.  Has a history of tubal ligation.  Of note, patient admits eating a lot of spicy foods.  She always eats with salsa, drinks a lot of coffee.  States that sometimes she has a hard time with this kind of pain when she lays down and feels a fullness in her throat.    No current facility-administered medications for this encounter.  Current Outpatient Medications:  .  cholecalciferol (VITAMIN D3) 25 MCG (1000 UT) tablet, Take 1,000 Units by mouth daily., Disp: , Rfl:  .  Multiple Vitamin (MULTIVITAMIN) tablet, Take 1 tablet by mouth daily., Disp: , Rfl:    Allergies  Allergen Reactions  . Amoxicillin     REACTION: urticaria and difficulty breathing    Past Medical History:  Diagnosis Date  . Migraine   . Vision abnormalities      Past Surgical History:  Procedure Laterality Date  . CESAREAN SECTION     X4  . TUBAL LIGATION      Family History  Problem Relation Age of Onset  . Diabetes Mother   . Hypertension Mother   . Cancer Maternal Grandmother        UTERINE  . Cancer Paternal Grandmother        SKIN  . Hypertension Father   . Healthy Sister   . Healthy Brother   . Healthy Sister   . Healthy Sister   . Healthy Sister   . Healthy Sister   . Healthy Sister   . Healthy Brother   . Healthy Brother   . Healthy Brother     Social History   Tobacco Use  .  Smoking status: Never Smoker  . Smokeless tobacco: Never Used  Substance Use Topics  . Alcohol use: Yes    Alcohol/week: 0.0 standard drinks    Comment: occasional  . Drug use: No    ROS Denies cough, shortness of breath, sinus congestion, sore throat, body aches.  Objective:   Vitals: BP (!) 144/80 (BP Location: Right Arm)   Pulse 76   Temp 98 F (36.7 C) (Oral)   Resp 18   Wt 170 lb (77.1 kg)   LMP 08/10/2019   SpO2 100%   BMI 27.44 kg/m   Physical Exam Constitutional:      General: She is not in acute distress.    Appearance: Normal appearance. She is well-developed. She is not ill-appearing, toxic-appearing or diaphoretic.  HENT:     Head: Normocephalic and atraumatic.     Nose: Nose normal.     Mouth/Throat:     Mouth: Mucous membranes are moist.  Eyes:     Extraocular Movements: Extraocular movements intact.     Pupils: Pupils are equal, round, and reactive to light.  Cardiovascular:     Rate and Rhythm: Normal rate and regular rhythm.  Pulses: Normal pulses.     Heart sounds: Normal heart sounds. No murmur. No friction rub. No gallop.   Pulmonary:     Effort: Pulmonary effort is normal. No respiratory distress.     Breath sounds: Normal breath sounds. No stridor. No wheezing, rhonchi or rales.  Skin:    General: Skin is warm and dry.     Findings: No rash.  Neurological:     Mental Status: She is alert and oriented to person, place, and time.  Psychiatric:        Mood and Affect: Mood normal.        Behavior: Behavior normal.        Thought Content: Thought content normal.        Judgment: Judgment normal.    DG Chest 2 View  Result Date: 08/31/2019 CLINICAL DATA:  Right-sided chest pain, fatigue EXAM: CHEST - 2 VIEW COMPARISON:  06/19/2016 FINDINGS: The heart size and mediastinal contours are within normal limits. Both lungs are clear. The visualized skeletal structures are unremarkable. IMPRESSION: No acute abnormality of the lungs.  Electronically Signed   By: Lauralyn Primes M.D.   On: 08/31/2019 15:18   ED ECG REPORT   Date: 08/31/2019  Rate: 68 bpm  Rhythm: normal sinus rhythm  QRS Axis: normal  Intervals: normal  ST/T Wave abnormalities: nonspecific T wave changes  Conduction Disutrbances:none  Narrative Interpretation: Sinus rhythm at 68 bpm with nonspecific T wave flattening, unchanged from previous EKG.  Old EKG Reviewed: unchanged  I have personally reviewed the EKG tracing and agree with the computerized printout as noted.  Assessment and Plan :   1. Atypical chest pain   2. Acute midline thoracic back pain   3. Gastroesophageal reflux disease without esophagitis     Physical exam findings reassuring, normal EKG and chest x-ray.  Start Prilosec and Pepcid.  Emphasized need for significant dietary modifications.  At this time, patient does not have any signs of acute cardiopulmonary event.  Vital signs and physical exam findings stable for discharge.  Counseled patient on potential for adverse effects with medications prescribed/recommended today, ER and return-to-clinic precautions discussed, patient verbalized understanding.    Wallis Bamberg, [place] 08/31/19 1616

## 2019-08-31 NOTE — ED Triage Notes (Signed)
Pt state she has back pain and chest pain x 2 weeks.  Pt state she tried to smoke cigarettes and the pain started .

## 2019-09-03 DIAGNOSIS — S336XXD Sprain of sacroiliac joint, subsequent encounter: Secondary | ICD-10-CM | POA: Diagnosis not present

## 2019-09-10 DIAGNOSIS — S336XXD Sprain of sacroiliac joint, subsequent encounter: Secondary | ICD-10-CM | POA: Diagnosis not present

## 2019-09-17 DIAGNOSIS — S336XXD Sprain of sacroiliac joint, subsequent encounter: Secondary | ICD-10-CM | POA: Diagnosis not present

## 2019-09-24 DIAGNOSIS — S336XXD Sprain of sacroiliac joint, subsequent encounter: Secondary | ICD-10-CM | POA: Diagnosis not present

## 2019-10-01 ENCOUNTER — Encounter: Payer: BLUE CROSS/BLUE SHIELD | Admitting: Obstetrics & Gynecology

## 2019-10-21 DIAGNOSIS — Z20828 Contact with and (suspected) exposure to other viral communicable diseases: Secondary | ICD-10-CM | POA: Diagnosis not present

## 2019-11-11 ENCOUNTER — Other Ambulatory Visit: Payer: Self-pay

## 2019-11-12 ENCOUNTER — Encounter: Payer: Self-pay | Admitting: Obstetrics & Gynecology

## 2019-11-12 ENCOUNTER — Ambulatory Visit: Payer: BC Managed Care – PPO | Admitting: Obstetrics & Gynecology

## 2019-11-12 VITALS — BP 128/80 | Ht 63.0 in | Wt 194.0 lb

## 2019-11-12 DIAGNOSIS — Z9851 Tubal ligation status: Secondary | ICD-10-CM | POA: Diagnosis not present

## 2019-11-12 DIAGNOSIS — F329 Major depressive disorder, single episode, unspecified: Secondary | ICD-10-CM

## 2019-11-12 DIAGNOSIS — E6609 Other obesity due to excess calories: Secondary | ICD-10-CM | POA: Diagnosis not present

## 2019-11-12 DIAGNOSIS — G43909 Migraine, unspecified, not intractable, without status migrainosus: Secondary | ICD-10-CM | POA: Diagnosis not present

## 2019-11-12 DIAGNOSIS — F419 Anxiety disorder, unspecified: Secondary | ICD-10-CM

## 2019-11-12 DIAGNOSIS — Z6834 Body mass index (BMI) 34.0-34.9, adult: Secondary | ICD-10-CM

## 2019-11-12 DIAGNOSIS — Z01419 Encounter for gynecological examination (general) (routine) without abnormal findings: Secondary | ICD-10-CM | POA: Diagnosis not present

## 2019-11-12 DIAGNOSIS — F32A Depression, unspecified: Secondary | ICD-10-CM

## 2019-11-12 NOTE — Progress Notes (Signed)
Dana Pope October 28, 1975 [id number]   History:    44 y.o. G4P4L4 Married.  S/P TL.  RP:  Established patient presenting for annual gyn exam   HPI: Menstrual periods every month with normal flow.  No breakthrough bleeding.  No pelvic pain.  No pain with intercourse.  Status post tubal ligation.  C/O frequent migraines for which she takes Ibuprofen.  Anxiety with family related issues.  Mild depressive symptoms.  No suicidal ideations.  Urine and bowel movements normal.  Breasts normal.  Body mass index 34.37.  Gaining weight. Not exercising regularly.  Health labs with family physician.  Past medical history,surgical history, family history and social history were all reviewed and documented in the EPIC chart.  Gynecologic History Patient's last menstrual period was 10/27/2019.  Obstetric History OB History  Gravida Para Term Preterm AB Living  4 4     0 4  SAB TAB Ectopic Multiple Live Births  0   0        # Outcome Date GA Lbr Len/2nd Weight Sex Delivery Anes PTL Lv  4 Para           3 Para           2 Para           1 Para              ROS: A ROS was performed and pertinent positives and negatives are included in the history.  GENERAL: No fevers or chills. HEENT: No change in vision, no earache, sore throat or sinus congestion. NECK: No pain or stiffness. CARDIOVASCULAR: No chest pain or pressure. No palpitations. PULMONARY: No shortness of breath, cough or wheeze. GASTROINTESTINAL: No abdominal pain, nausea, vomiting or diarrhea, melena or bright red blood per rectum. GENITOURINARY: No urinary frequency, urgency, hesitancy or dysuria. MUSCULOSKELETAL: No joint or muscle pain, no back pain, no recent trauma. DERMATOLOGIC: No rash, no itching, no lesions. ENDOCRINE: No polyuria, polydipsia, no heat or cold intolerance. No recent change in weight. HEMATOLOGICAL: No anemia or easy bruising or bleeding. NEUROLOGIC: No headache, seizures, numbness, tingling or weakness.  PSYCHIATRIC: No depression, no loss of interest in normal activity or change in sleep pattern.     Exam:   BP 128/80 (BP Location: Right Arm, Patient Position: Sitting, Cuff Size: Large)   Ht 5\' 3"  (1.6 m)   Wt 194 lb (88 kg)   LMP 10/27/2019   BMI 34.37 kg/m   Body mass index is 34.37 kg/m.  General appearance : Well developed well nourished female. No acute distress HEENT: Eyes: no retinal hemorrhage or exudates,  Neck supple, trachea midline, no carotid bruits, no thyroidmegaly Lungs: Clear to auscultation, no rhonchi or wheezes, or rib retractions  Heart: Regular rate and rhythm, no murmurs or gallops Breast:Examined in sitting and supine position were symmetrical in appearance, no palpable masses or tenderness,  no skin retraction, no nipple inversion, no nipple discharge, no skin discoloration, no axillary or supraclavicular lymphadenopathy Abdomen: no palpable masses or tenderness, no rebound or guarding Extremities: no edema or skin discoloration or tenderness  Pelvic: Vulva: Normal             Vagina: No gross lesions or discharge  Cervix: No gross lesions or discharge  Uterus  AV, normal size, shape and consistency, non-tender and mobile  Adnexa  Without masses or tenderness  Anus: Normal   Assessment/Plan:  44 y.o. female for annual exam   1. Well female exam with  routine gynecological exam Normal gynecologic exam.  Last Pap test March 2020 was negative, no indication to repeat this year.  Breast exam normal.  Last mammogram February 2021 was negative.  Health labs with family physician.  2. S/P tubal ligation  3. Migraine without status migrainosus, not intractable, unspecified migraine type Probable migraines worsening recently associated with anxiety.  No accompanying neurologic symptoms.  Refer to Neurologist for investigation and management.  4. Anxiety and depression Recommend Psychotherapy.  5. Class 1 obesity due to excess calories without serious  comorbidity with body mass index (BMI) of 34.0 to 34.9 in adult Lower calorie/carb diet such as [employer].  Aerobic activities 5 times a week and light weightlifting every 2 days recommended.  Genia Del MD, 2:13 PM 11/12/2019

## 2019-11-13 ENCOUNTER — Telehealth: Payer: Self-pay | Admitting: *Deleted

## 2019-11-13 DIAGNOSIS — G43019 Migraine without aura, intractable, without status migrainosus: Secondary | ICD-10-CM

## 2019-11-13 DIAGNOSIS — R6882 Decreased libido: Secondary | ICD-10-CM

## 2019-11-13 NOTE — Telephone Encounter (Signed)
-----   Message from Genia Del, MD sent at 11/12/2019  2:42 PM EDT ----- Regarding: Refer to Neurology Worsening migraines

## 2019-11-13 NOTE — Telephone Encounter (Signed)
Referral placed at [facility] they will call to schedule. 

## 2019-11-15 ENCOUNTER — Encounter: Payer: Self-pay | Admitting: Obstetrics & Gynecology

## 2019-11-15 NOTE — Patient Instructions (Signed)
1. Well female exam with routine gynecological exam Normal gynecologic exam.  Last Pap test March 2020 was negative, no indication to repeat this year.  Breast exam normal.  Last mammogram February 2021 was negative.  Health labs with family physician.  2. S/P tubal ligation  3. Migraine without status migrainosus, not intractable, unspecified migraine type Probable migraines worsening recently associated with anxiety.  No accompanying neurologic symptoms.  Refer to Neurologist for investigation and management.  4. Anxiety and depression Recommend Psychotherapy.  5. Class 1 obesity due to excess calories without serious comorbidity with body mass index (BMI) of 34.0 to 34.9 in adult Lower calorie/carb diet such as [employer].  Aerobic activities 5 times a week and light weightlifting every 2 days recommended.  Charis, it was a pleasure seeing you today!

## 2019-11-17 ENCOUNTER — Telehealth: Payer: Self-pay | Admitting: *Deleted

## 2019-11-17 NOTE — Telephone Encounter (Signed)
Office notes faxed to Dr.Mary Mardee Postin at [phone], they will call patient to schedule.

## 2019-11-17 NOTE — Telephone Encounter (Signed)
-----   Message from Genia Del, MD sent at 11/12/2019  2:43 PM EDT ----- Regarding: Recommend Psychotherapist who speaks Spanish Anxiety and depressive symptoms.

## 2019-11-19 NOTE — Telephone Encounter (Signed)
Kelly called from [facility] and said Dr.Garcia is not accepting any new patients. Tresa Endo checked with Dr.Garcia and she gave her some other providers. Tresa Endo is going to check to discuss this with the patient.

## 2019-11-24 NOTE — Telephone Encounter (Signed)
[facility] has called and spoke with the patient, it appears the patient is discuss financial payments with this office and will call back to schedule.

## 2019-12-31 ENCOUNTER — Other Ambulatory Visit: Payer: Self-pay

## 2019-12-31 ENCOUNTER — Ambulatory Visit: Payer: BC Managed Care – PPO | Admitting: Family Medicine

## 2019-12-31 ENCOUNTER — Ambulatory Visit (HOSPITAL_COMMUNITY)
Admission: EM | Admit: 2019-12-31 | Discharge: 2019-12-31 | Disposition: A | Discharge status: Home / Self Care | Payer: BC Managed Care – PPO | Attending: Family Medicine | Admitting: Family Medicine

## 2019-12-31 DIAGNOSIS — G43909 Migraine, unspecified, not intractable, without status migrainosus: Secondary | ICD-10-CM

## 2019-12-31 MED ORDER — METOCLOPRAMIDE HCL 5 MG/ML IJ SOLN
5.0000 mg | Freq: Once | INTRAMUSCULAR | Status: AC
  Administered 2019-12-31: 5 mg via INTRAMUSCULAR

## 2019-12-31 MED ORDER — METOCLOPRAMIDE HCL 5 MG/ML IJ SOLN
INTRAMUSCULAR | Status: AC
  Filled 2019-12-31: qty 2

## 2019-12-31 MED ORDER — KETOROLAC TROMETHAMINE 30 MG/ML IJ SOLN
30.0000 mg | Freq: Once | INTRAMUSCULAR | Status: AC
  Administered 2019-12-31: 30 mg via INTRAMUSCULAR

## 2019-12-31 MED ORDER — KETOROLAC TROMETHAMINE 30 MG/ML IJ SOLN
INTRAMUSCULAR | Status: AC
  Filled 2019-12-31: qty 1

## 2019-12-31 MED ORDER — DEXAMETHASONE SODIUM PHOSPHATE 10 MG/ML IJ SOLN
INTRAMUSCULAR | Status: AC
  Filled 2019-12-31: qty 1

## 2019-12-31 MED ORDER — DEXAMETHASONE SODIUM PHOSPHATE 10 MG/ML IJ SOLN
10.0000 mg | Freq: Once | INTRAMUSCULAR | Status: AC
  Administered 2019-12-31: 10 mg via INTRAMUSCULAR

## 2019-12-31 NOTE — Discharge Instructions (Signed)
We gave you toradol, decadron and reglan for your headache  Use anti-inflammatories for pain/swelling. You may take up to 800 mg Ibuprofen every 8 hours with food. You may supplement Ibuprofen with Tylenol [id number] mg every 8 hours.  OR Naproxen 500 mg twice daily  Follow up in emergency room if symptoms worsening/changing

## 2019-12-31 NOTE — ED Triage Notes (Addendum)
Pt presents with headache, weak and  nausea x 1 week; right eye twitching since this morning.  Pt is taking Tylenol 3000 mg, acetaminophen, aspirin ad caffeine  500 mg and  Naproxen 1760 mg daily for the past 2 days.

## 2019-12-31 NOTE — ED Provider Notes (Signed)
[facility]    CSN: [id number] Arrival date & time: 12/31/19  1324      History   Chief Complaint Chief Complaint  Patient presents with  . Headache    HPI Dana Pope is a 44 y.o. female history of migraines presenting today for evaluation of headache.  Patient has had a persistent headache for approximately 1 week.  Feels at bilateral temples and pressure to the crown of her head.  Has had associated nausea, reports some mild blurring and twitching of eye.  This is normal for her migraines.  Symptoms more persistent and prominent than typical migraines.  Has been using Tylenol, acetaminophen aspirin and red bull without relief of symptoms.  She denies any head injury.  Denies URI symptoms.  Does report increased stress of recently.  HPI  Past Medical History:  Diagnosis Date  . Migraine   . Vision abnormalities     Patient Active Problem List   Diagnosis Date Noted  . Strain of right trapezius muscle 03/15/2018  . Abdominal pain 01/11/2018  . Fibrocystic breast changes of both breasts 12/09/2014  . Toenail deformity 10/30/2014  . DUB (dysfunctional uterine bleeding) 03/05/2014  . Decreased libido 03/05/2014  . Benign positional vertigo 12/21/2010  . PALPITATIONS 08/03/2008  . OVERWEIGHT 01/06/2008  . DEPRESSION 01/06/2008  . Back pain 01/06/2008  . Headache 01/06/2008    Past Surgical History:  Procedure Laterality Date  . CESAREAN SECTION     X4  . TUBAL LIGATION      OB History    Gravida  4   Para  4   Term      Preterm      AB  0   Living  4     SAB  0   TAB      Ectopic  0   Multiple      Live Births               Home Medications    Prior to Admission medications   Medication Sig Start Date End Date Taking? Authorizing Provider  acetaminophen (TYLENOL) 500 MG tablet Take 1,000 mg by mouth every 6 (six) hours as needed.   Yes [provider]  aspirin-acetaminophen-caffeine (EXCEDRIN MIGRAINE)  [phone] MG tablet Take 2 tablets by mouth every 6 (six) hours as needed for headache.    Yes [provider]  naproxen sodium (ALEVE) 220 MG tablet Take 220 mg by mouth. Pt takes 8 tab 220 mg daily   Yes [provider]  albuterol (VENTOLIN HFA) 108 (90 Base) MCG/ACT inhaler SMARTSIG:1-2 Puff(s) Via Inhaler Every 4-6 Hours PRN 07/14/19   [provider]    Family History Family History  Problem Relation Age of Onset  . Diabetes Mother   . Hypertension Mother   . Cancer Maternal Grandmother        UTERINE  . Cancer Paternal Grandmother        SKIN  . Hypertension Father   . Healthy Sister   . Healthy Brother   . Healthy Sister   . Healthy Sister   . Healthy Sister   . Healthy Sister   . Healthy Sister   . Healthy Brother   . Healthy Brother   . Healthy Brother     Social History Social History   Tobacco Use  . Smoking status: Never Smoker  . Smokeless tobacco: Never Used  Vaping Use  . Vaping Use: Never used  Substance Use Topics  .  Alcohol use: Not Currently    Alcohol/week: 0.0 standard drinks  . Drug use: No     Allergies   Amoxicillin   Review of Systems Review of Systems  Constitutional: Negative for fatigue and fever.  HENT: Negative for congestion, sinus pressure and sore throat.   Eyes: Positive for photophobia. Negative for pain and visual disturbance.  Respiratory: Negative for cough and shortness of breath.   Cardiovascular: Negative for chest pain.  Gastrointestinal: Positive for nausea. Negative for abdominal pain and vomiting.  Genitourinary: Negative for decreased urine volume and hematuria.  Musculoskeletal: Positive for neck pain. Negative for myalgias and neck stiffness.  Neurological: Positive for headaches. Negative for dizziness, syncope, facial asymmetry, speech difficulty, weakness, light-headedness and numbness.     Physical Exam Triage Vital Signs ED Triage Vitals  Enc Vitals Group     BP 12/31/19  1426 117/83     Pulse Rate 12/31/19 1426 72     Resp 12/31/19 1426 19     Temp 12/31/19 1426 98.1 F (36.7 C)     Temp Source 12/31/19 1426 Oral     SpO2 12/31/19 1426 100 %     Weight --      Height --      Head Circumference --      Peak Flow --      Pain Score 12/31/19 1433 8     Pain Loc --      Pain Edu? --      Excl. in GC? --    No data found.  Updated Vital Signs BP 117/83 (BP Location: Right Arm)   Pulse 72   Temp 98.1 F (36.7 C) (Oral)   Resp 19   LMP  (Within Weeks) Comment: 2 weeks  SpO2 100%   Visual Acuity Right Eye Distance:   Left Eye Distance:   Bilateral Distance:    Right Eye Near:   Left Eye Near:    Bilateral Near:     Physical Exam Vitals and nursing note reviewed.  Constitutional:      Appearance: She is well-developed.     Comments: No acute distress  HENT:     Head: Normocephalic and atraumatic.     Ears:     Comments: No hemotympanum    Nose: Nose normal.     Mouth/Throat:     Comments: Oral mucosa pink and moist, no tonsillar enlargement or exudate. Posterior pharynx patent and nonerythematous, no uvula deviation or swelling. Normal phonation. Eyes:     Conjunctiva/sclera: Conjunctivae normal.  Cardiovascular:     Rate and Rhythm: Normal rate.  Pulmonary:     Effort: Pulmonary effort is normal. No respiratory distress.  Abdominal:     General: There is no distension.  Musculoskeletal:        General: Normal range of motion.     Cervical back: Neck supple.  Skin:    General: Skin is warm and dry.  Neurological:     General: No focal deficit present.     Mental Status: She is alert and oriented to person, place, and time. Mental status is at baseline.     Cranial Nerves: No cranial nerve deficit.     Motor: No weakness.     Comments: Patient A&O x3, cranial nerves II-XII grossly intact, strength at shoulders, hips and knees 5/5, equal bilaterally, patellar reflex 2+ bilaterally Negative Romberg . Gait without abnormality.       UC Treatments / Results  Labs (all labs ordered are  listed, but only abnormal results are displayed) Labs Reviewed - No data to display  EKG   Radiology No results found.  Procedures Procedures (including critical care time)  Medications Ordered in UC Medications  ketorolac (TORADOL) 30 MG/ML injection 30 mg (30 mg Intramuscular Given 12/31/19 1523)  metoCLOPramide (REGLAN) injection 5 mg (5 mg Intramuscular Given 12/31/19 1522)  dexamethasone (DECADRON) injection 10 mg (10 mg Intramuscular Given 12/31/19 1523)    Initial Impression / Assessment and Plan / UC Course  I have reviewed the triage vital signs and the nursing notes.  Pertinent labs & imaging results that were available during my care of the patient were reviewed by me and considered in my medical decision making (see chart for details).     No neuro deficits, suspect likely migraine, provided migraine cocktail with Toradol, Decadron and Reglan.  Continue with Naprosyn for further headache management at home.  Follow-up in emergency room if headache symptoms worsening/persisting without relief despite use of migraine cocktail.  Follow-up with PCP for recurrent headaches. Discussed strict return precautions. Patient verbalized understanding and is agreeable with plan.   Final Clinical Impressions(s) / UC Diagnoses   Final diagnoses:  Migraine without status migrainosus, not intractable, unspecified migraine type     Discharge Instructions     We gave you toradol, decadron and reglan for your headache  Use anti-inflammatories for pain/swelling. You may take up to 800 mg Ibuprofen every 8 hours with food. You may supplement Ibuprofen with Tylenol [phone] mg every 8 hours.  OR Naproxen 500 mg twice daily  Follow up in emergency room if symptoms worsening/changing    ED Prescriptions    None     PDMP not reviewed this encounter.   Siler Mavis, Florence C, PA-C 01/01/20 1700

## 2020-02-25 DIAGNOSIS — Z1152 Encounter for screening for COVID-19: Secondary | ICD-10-CM | POA: Diagnosis not present

## 2020-03-02 ENCOUNTER — Ambulatory Visit: Payer: BC Managed Care – PPO

## 2020-03-02 NOTE — Progress Notes (Deleted)
    SUBJECTIVE:   CHIEF COMPLAINT / HPI:        PERTINENT  PMH / PSH: ***  OBJECTIVE:   There were no vitals taken for this visit.  ***  ASSESSMENT/PLAN:   No problem-specific Assessment & Plan notes found for this encounter.     Dana Pope Dana [name], MD [facility]   

## 2020-03-17 DIAGNOSIS — M5416 Radiculopathy, lumbar region: Secondary | ICD-10-CM | POA: Diagnosis not present

## 2020-03-17 DIAGNOSIS — M62830 Muscle spasm of back: Secondary | ICD-10-CM | POA: Diagnosis not present

## 2020-03-17 DIAGNOSIS — M9903 Segmental and somatic dysfunction of lumbar region: Secondary | ICD-10-CM | POA: Diagnosis not present

## 2020-03-24 DIAGNOSIS — M62830 Muscle spasm of back: Secondary | ICD-10-CM | POA: Diagnosis not present

## 2020-03-24 DIAGNOSIS — M5416 Radiculopathy, lumbar region: Secondary | ICD-10-CM | POA: Diagnosis not present

## 2020-03-24 DIAGNOSIS — M9903 Segmental and somatic dysfunction of lumbar region: Secondary | ICD-10-CM | POA: Diagnosis not present

## 2020-03-31 DIAGNOSIS — M5416 Radiculopathy, lumbar region: Secondary | ICD-10-CM | POA: Diagnosis not present

## 2020-03-31 DIAGNOSIS — M62830 Muscle spasm of back: Secondary | ICD-10-CM | POA: Diagnosis not present

## 2020-03-31 DIAGNOSIS — M9903 Segmental and somatic dysfunction of lumbar region: Secondary | ICD-10-CM | POA: Diagnosis not present

## 2020-04-07 DIAGNOSIS — M5416 Radiculopathy, lumbar region: Secondary | ICD-10-CM | POA: Diagnosis not present

## 2020-04-07 DIAGNOSIS — M62830 Muscle spasm of back: Secondary | ICD-10-CM | POA: Diagnosis not present

## 2020-04-07 DIAGNOSIS — M9903 Segmental and somatic dysfunction of lumbar region: Secondary | ICD-10-CM | POA: Diagnosis not present

## 2020-04-19 DIAGNOSIS — M62830 Muscle spasm of back: Secondary | ICD-10-CM | POA: Diagnosis not present

## 2020-04-19 DIAGNOSIS — M5416 Radiculopathy, lumbar region: Secondary | ICD-10-CM | POA: Diagnosis not present

## 2020-04-19 DIAGNOSIS — M9903 Segmental and somatic dysfunction of lumbar region: Secondary | ICD-10-CM | POA: Diagnosis not present

## 2020-04-29 DIAGNOSIS — D485 Neoplasm of uncertain behavior of skin: Secondary | ICD-10-CM | POA: Diagnosis not present

## 2020-04-29 DIAGNOSIS — D224 Melanocytic nevi of scalp and neck: Secondary | ICD-10-CM | POA: Diagnosis not present

## 2020-05-12 DIAGNOSIS — M9903 Segmental and somatic dysfunction of lumbar region: Secondary | ICD-10-CM | POA: Diagnosis not present

## 2020-05-12 DIAGNOSIS — M5416 Radiculopathy, lumbar region: Secondary | ICD-10-CM | POA: Diagnosis not present

## 2020-05-12 DIAGNOSIS — M62830 Muscle spasm of back: Secondary | ICD-10-CM | POA: Diagnosis not present

## 2020-05-14 DIAGNOSIS — H1132 Conjunctival hemorrhage, left eye: Secondary | ICD-10-CM | POA: Diagnosis not present

## 2020-05-14 DIAGNOSIS — H53042 Amblyopia suspect, left eye: Secondary | ICD-10-CM | POA: Diagnosis not present

## 2020-08-31 ENCOUNTER — Other Ambulatory Visit: Payer: Self-pay | Admitting: Obstetrics & Gynecology

## 2020-08-31 DIAGNOSIS — Z0000 Encounter for general adult medical examination without abnormal findings: Secondary | ICD-10-CM

## 2020-09-03 ENCOUNTER — Other Ambulatory Visit: Payer: Self-pay

## 2020-09-03 ENCOUNTER — Ambulatory Visit (HOSPITAL_COMMUNITY)
Admission: EM | Admit: 2020-09-03 | Discharge: 2020-09-03 | Disposition: A | Discharge status: Home / Self Care | Payer: BC Managed Care – PPO | Attending: Family Medicine | Admitting: Family Medicine

## 2020-09-03 ENCOUNTER — Encounter (HOSPITAL_COMMUNITY): Payer: Self-pay

## 2020-09-03 DIAGNOSIS — H9202 Otalgia, left ear: Secondary | ICD-10-CM

## 2020-09-03 DIAGNOSIS — R519 Headache, unspecified: Secondary | ICD-10-CM

## 2020-09-03 DIAGNOSIS — K2100 Gastro-esophageal reflux disease with esophagitis, without bleeding: Secondary | ICD-10-CM

## 2020-09-03 NOTE — ED Triage Notes (Signed)
Pt presents with left ear pain X 5 months. She states it is a nagging pain. Pt states she thought it was because of her wisdom teeth. She states when she is out in the cold her left ear is in pain. Pt states she has been having constant headaches.

## 2020-09-03 NOTE — Discharge Instructions (Signed)
We have placed a referral to ENT for your ear pain and throat irritation  May use omeprazole as needed for heart burn  Follow up with this office or with primary care if symptoms are persisting.  Follow up in the ER for high fever, trouble swallowing, trouble breathing, other concerning symptoms.

## 2020-09-03 NOTE — ED Provider Notes (Signed)
[facility]    CSN: [id number] Arrival date & time: 09/03/20  1601      History   Chief Complaint Chief Complaint  Patient presents with  . Ear Pain    HPI Dana Pope is a 45 y.o. female.   Reports L otalgia and intermittent headaches for the last year since having covid. Reports that she did have pneumonia and that she sometimes still has to use her inhaler. Reports that the ear pain has been bothering her for about 5 months. Reports that she just finished a course of penicillin for L ear infection. Reports that she has been taking ibuprofen and tylenol with temporary pain relief, but states that the pain always returns. Reports increase in heartburn over the last few months. States that she thinks that this is due to her taking so much OTC pain medications. Has not attempted to treat at home. Denies ear discharge, changes in vision, nausea, vomiting, diarrhea, rash, fever, other symptoms. Patient is requesting referral for ENT today. States that she cannot get in with her PCP to get this done.  ROS per HPI  The history is provided by the patient.    Past Medical History:  Diagnosis Date  . Migraine   . Vision abnormalities     Patient Active Problem List   Diagnosis Date Noted  . Strain of right trapezius muscle 03/15/2018  . Abdominal pain 01/11/2018  . Fibrocystic breast changes of both breasts 12/09/2014  . Toenail deformity 10/30/2014  . DUB (dysfunctional uterine bleeding) 03/05/2014  . Decreased libido 03/05/2014  . Benign positional vertigo 12/21/2010  . PALPITATIONS 08/03/2008  . OVERWEIGHT 01/06/2008  . DEPRESSION 01/06/2008  . Back pain 01/06/2008  . Headache 01/06/2008    Past Surgical History:  Procedure Laterality Date  . CESAREAN SECTION     X4  . TUBAL LIGATION      OB History    Gravida  4   Para  4   Term      Preterm      AB  0   Living  4     SAB  0   IAB      Ectopic  0   Multiple      Live  Births               Home Medications    Prior to Admission medications   Medication Sig Start Date End Date Taking? Authorizing Provider  acetaminophen (TYLENOL) 500 MG tablet Take 1,000 mg by mouth every 6 (six) hours as needed.    [provider]  albuterol (VENTOLIN HFA) 108 (90 Base) MCG/ACT inhaler SMARTSIG:1-2 Puff(s) Via Inhaler Every 4-6 Hours PRN 07/14/19   [provider]  aspirin-acetaminophen-caffeine (EXCEDRIN MIGRAINE) [phone] MG tablet Take 2 tablets by mouth every 6 (six) hours as needed for headache.     [provider]  naproxen sodium (ALEVE) 220 MG tablet Take 220 mg by mouth. Pt takes 8 tab 220 mg daily    [provider]    Family History Family History  Problem Relation Age of Onset  . Diabetes Mother   . Hypertension Mother   . Cancer Maternal Grandmother        UTERINE  . Cancer Paternal Grandmother        SKIN  . Hypertension Father   . Healthy Sister   . Healthy Brother   . Healthy Sister   . Healthy Sister   . Healthy Sister   .  Healthy Sister   . Healthy Sister   . Healthy Brother   . Healthy Brother   . Healthy Brother     Social History Social History   Tobacco Use  . Smoking status: Never Smoker  . Smokeless tobacco: Never Used  Vaping Use  . Vaping Use: Never used  Substance Use Topics  . Alcohol use: Not Currently    Alcohol/week: 0.0 standard drinks  . Drug use: No     Allergies   Amoxicillin   Review of Systems Review of Systems   Physical Exam Triage Vital Signs ED Triage Vitals  Enc Vitals Group     BP 09/03/20 1623 (!) 147/93     Pulse Rate 09/03/20 1623 70     Resp 09/03/20 1623 18     Temp 09/03/20 1623 98.1 F (36.7 C)     Temp Source 09/03/20 1623 Oral     SpO2 09/03/20 1623 98 %     Weight --      Height --      Head Circumference --      Peak Flow --      Pain Score 09/03/20 1622 6     Pain Loc --      Pain Edu? --      Excl. in GC? --    No data  found.  Updated Vital Signs BP (!) 147/93 (BP Location: Right Arm)   Pulse 70   Temp 98.1 F (36.7 C) (Oral)   Resp 18   LMP 08/31/2020 (Exact Date)   SpO2 98%    Physical Exam Vitals and nursing note reviewed.  Constitutional:      General: She is not in acute distress.    Appearance: Normal appearance. She is well-developed and well-nourished. She is not ill-appearing.  HENT:     Head: Normocephalic and atraumatic.     Right Ear: Tympanic membrane, ear canal and external ear normal.     Left Ear: Tympanic membrane, ear canal and external ear normal.     Nose: Nose normal.     Mouth/Throat:     Mouth: Mucous membranes are moist.     Pharynx: Oropharynx is clear.  Eyes:     Extraocular Movements: Extraocular movements intact.     Conjunctiva/sclera: Conjunctivae normal.     Pupils: Pupils are equal, round, and reactive to light.  Cardiovascular:     Rate and Rhythm: Normal rate and regular rhythm.     Heart sounds: Normal heart sounds. No murmur heard.   Pulmonary:     Effort: Pulmonary effort is normal. No respiratory distress.     Breath sounds: Normal breath sounds.  Abdominal:     General: There is no distension.     Palpations: Abdomen is soft. There is no mass.     Tenderness: There is no abdominal tenderness. There is no right CVA tenderness, left CVA tenderness, guarding or rebound.     Hernia: No hernia is present.  Musculoskeletal:        General: No edema. Normal range of motion.     Cervical back: Normal range of motion and neck supple.  Skin:    General: Skin is warm and dry.     Capillary Refill: Capillary refill takes less than 2 seconds.  Neurological:     General: No focal deficit present.     Mental Status: She is alert.  Psychiatric:        Mood and Affect: Mood and affect and mood  normal.        Behavior: Behavior normal.        Thought Content: Thought content normal.      UC Treatments / Results  Labs (all labs ordered are listed, but  only abnormal results are displayed) Labs Reviewed - No data to display  EKG   Radiology No results found.  Procedures Procedures (including critical care time)  Medications Ordered in UC Medications - No data to display  Initial Impression / Assessment and Plan / UC Course  I have reviewed the triage vital signs and the nursing notes.  Pertinent labs & imaging results that were available during my care of the patient were reviewed by me and considered in my medical decision making (see chart for details).    Headache L otalgia GERD  Penicillin has likely treated your ear infection May continue ibuprofen and tylenol for now Continue albuterol as needed May take omeprazole daily for GERD Follow up with PCP Referral placed for ENT today Follow up with this office or with primary care if symptoms are persisting.  Follow up in the ER for high fever, trouble swallowing, trouble breathing, other concerning symptoms.    Final Clinical Impressions(s) / UC Diagnoses   Final diagnoses:  Nonintractable headache, unspecified chronicity pattern, unspecified headache type  Otalgia, left  Gastroesophageal reflux disease with esophagitis without hemorrhage     Discharge Instructions     We have placed a referral to ENT for your ear pain and throat irritation  May use omeprazole as needed for heart burn  Follow up with this office or with primary care if symptoms are persisting.  Follow up in the ER for high fever, trouble swallowing, trouble breathing, other concerning symptoms.     ED Prescriptions    None     PDMP not reviewed this encounter.   Moshe Cipro, NP 09/03/20 1825

## 2020-09-05 DIAGNOSIS — H9202 Otalgia, left ear: Secondary | ICD-10-CM | POA: Diagnosis not present

## 2020-09-05 DIAGNOSIS — R519 Headache, unspecified: Secondary | ICD-10-CM | POA: Diagnosis not present

## 2020-10-21 DIAGNOSIS — H9202 Otalgia, left ear: Secondary | ICD-10-CM | POA: Diagnosis not present

## 2020-10-21 DIAGNOSIS — H9203 Otalgia, bilateral: Secondary | ICD-10-CM | POA: Diagnosis not present

## 2020-10-21 DIAGNOSIS — H9312 Tinnitus, left ear: Secondary | ICD-10-CM | POA: Diagnosis not present

## 2020-10-27 ENCOUNTER — Other Ambulatory Visit: Payer: Self-pay

## 2020-10-27 ENCOUNTER — Ambulatory Visit
Admission: RE | Admit: 2020-10-27 | Discharge: 2020-10-27 | Disposition: A | Discharge status: Home / Self Care | Payer: BC Managed Care – PPO | Source: Ambulatory Visit | Attending: Obstetrics & Gynecology | Admitting: Obstetrics & Gynecology

## 2020-10-27 DIAGNOSIS — Z0000 Encounter for general adult medical examination without abnormal findings: Secondary | ICD-10-CM

## 2020-10-27 DIAGNOSIS — Z1231 Encounter for screening mammogram for malignant neoplasm of breast: Secondary | ICD-10-CM | POA: Diagnosis not present

## 2020-12-24 ENCOUNTER — Other Ambulatory Visit: Payer: Self-pay | Admitting: Obstetrics and Gynecology

## 2020-12-24 ENCOUNTER — Ambulatory Visit
Admission: RE | Admit: 2020-12-24 | Discharge: 2020-12-24 | Disposition: A | Discharge status: Home / Self Care | Payer: BC Managed Care – PPO | Source: Ambulatory Visit | Attending: Obstetrics and Gynecology | Admitting: Obstetrics and Gynecology

## 2020-12-24 DIAGNOSIS — M542 Cervicalgia: Secondary | ICD-10-CM

## 2020-12-24 DIAGNOSIS — M5410 Radiculopathy, site unspecified: Secondary | ICD-10-CM

## 2020-12-24 DIAGNOSIS — M4722 Other spondylosis with radiculopathy, cervical region: Secondary | ICD-10-CM | POA: Diagnosis not present

## 2020-12-24 DIAGNOSIS — M50123 Cervical disc disorder at C6-C7 level with radiculopathy: Secondary | ICD-10-CM | POA: Diagnosis not present

## 2020-12-29 ENCOUNTER — Other Ambulatory Visit: Payer: Self-pay | Admitting: Obstetrics and Gynecology

## 2020-12-29 DIAGNOSIS — M5412 Radiculopathy, cervical region: Secondary | ICD-10-CM

## 2021-01-12 ENCOUNTER — Ambulatory Visit
Admission: RE | Admit: 2021-01-12 | Discharge: 2021-01-12 | Disposition: A | Discharge status: Home / Self Care | Payer: BC Managed Care – PPO | Source: Ambulatory Visit | Attending: Obstetrics and Gynecology | Admitting: Obstetrics and Gynecology

## 2021-01-12 DIAGNOSIS — M5412 Radiculopathy, cervical region: Secondary | ICD-10-CM

## 2021-01-12 DIAGNOSIS — M50222 Other cervical disc displacement at C5-C6 level: Secondary | ICD-10-CM | POA: Diagnosis not present

## 2021-01-12 DIAGNOSIS — M5021 Other cervical disc displacement,  high cervical region: Secondary | ICD-10-CM | POA: Diagnosis not present

## 2021-01-12 DIAGNOSIS — M50221 Other cervical disc displacement at C4-C5 level: Secondary | ICD-10-CM | POA: Diagnosis not present

## 2021-01-17 ENCOUNTER — Encounter: Payer: Self-pay | Admitting: Neurology

## 2021-03-05 ENCOUNTER — Encounter (HOSPITAL_COMMUNITY): Payer: Self-pay | Admitting: *Deleted

## 2021-03-05 ENCOUNTER — Ambulatory Visit (HOSPITAL_COMMUNITY)
Admission: EM | Admit: 2021-03-05 | Discharge: 2021-03-05 | Disposition: A | Discharge status: Home / Self Care | Payer: BC Managed Care – PPO

## 2021-03-05 DIAGNOSIS — M545 Low back pain, unspecified: Secondary | ICD-10-CM

## 2021-03-05 DIAGNOSIS — G8929 Other chronic pain: Secondary | ICD-10-CM

## 2021-03-05 HISTORY — DX: Dorsalgia, unspecified: M54.9

## 2021-03-05 MED ORDER — PREDNISONE 10 MG PO TABS: 20.0000 mg | ORAL_TABLET | Freq: Every day | ORAL | 0 refills | Status: AC

## 2021-03-05 MED ORDER — CYCLOBENZAPRINE HCL 10 MG PO TABS: 10.0000 mg | ORAL_TABLET | Freq: Two times a day (BID) | ORAL | 0 refills | Status: DC | PRN

## 2021-03-05 NOTE — Discharge Instructions (Addendum)
Encouraged patient to take prednisone as prescribed and use muscle relaxer as needed. Discussed that muscle relaxer may cause some drowsiness and to use with caution. Recommended she contact her PCP for referral to specialist. Encouraged sooner follow up with any worsening or further concerns.
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
## 2021-04-04 ENCOUNTER — Ambulatory Visit: Payer: BC Managed Care – PPO | Admitting: Family Medicine

## 2021-04-04 NOTE — Progress Notes (Deleted)
No Show

## 2021-04-06 NOTE — Progress Notes (Signed)
NEUROLOGY CONSULTATION NOTE  Dana Pope MRN: [id number] DOB: 1976-05-18  Referring provider: Darrin Nipper, MD Primary care provider: Jackelyn Poling, DO  Reason for consult:  headache  Assessment/Plan:   Left sided TMJ dysfunction Cervicalgia  I will refer patient to physical therapy for neck pain.  Ultimately, she will require follow-up and treatment by the dentist or possibly a periodontist.  She takes cyclobenzaprine for back pain which she says helps with the jaw and neck pain.     Subjective:  Dana Pope is a 45 year old female who presents for headaches.  History supplemented by referring provider's note.  Accompanied by interpreter.  She started having left sided ear pain about a year ago, radiating down the left side of her jaw and back to the left occipital region down the posterior left sided neck and into the shoulder.  No preceding trauma but she thinks it may have to do with physical activity required at work.  She also reports a lot of stress.  No radicular pain or numbness down the arm and into the fingers.  She also would hear a rubbing sound in her left ear whenever she would talk or move her jaw.  She saw ENT who diagnosed TMJ dysfunction. She followed up with the dentist who agreed.  She is supposed to follow up in November.  She does have chronic neck pain.  MRI of cervical spine on 01/12/2021 was personally reviewed and showed small central disc protrusions at C3-4 and C6-7 without cord compression or foraminal stenosis.  Remote MRI of brain with and without contrast from 07/10/2018 (to presumably evaluate for MS as she presented with numbness and tingling of all extremities), was personally reviewed and was normal.    PAST MEDICAL HISTORY: Past Medical History:  Diagnosis Date   Back pain    Migraine    Vision abnormalities     PAST SURGICAL HISTORY: Past Surgical History:  Procedure Laterality Date   CESAREAN SECTION     X4   TUBAL  LIGATION      MEDICATIONS: Current Outpatient Medications on File Prior to Visit  Medication Sig Dispense Refill   acetaminophen (TYLENOL) 500 MG tablet Take 1,000 mg by mouth every 6 (six) hours as needed.     albuterol (VENTOLIN HFA) 108 (90 Base) MCG/ACT inhaler SMARTSIG:1-2 Puff(s) Via Inhaler Every 4-6 Hours PRN     aspirin-acetaminophen-caffeine (EXCEDRIN MIGRAINE) 250-250-65 MG tablet Take 2 tablets by mouth every 6 (six) hours as needed for headache.      cyclobenzaprine (FLEXERIL) 10 MG tablet Take 1 tablet (10 mg total) by mouth 2 (two) times daily as needed for muscle spasms. 20 tablet 0   IBUPROFEN PO Take by mouth.     naproxen sodium (ALEVE) 220 MG tablet Take 220 mg by mouth. Pt takes 8 tab 220 mg daily     No current facility-administered medications on file prior to visit.    ALLERGIES: Allergies  Allergen Reactions   Amoxicillin     REACTION: urticaria and difficulty breathing    FAMILY HISTORY: Family History  Problem Relation Age of Onset   Diabetes Mother    Hypertension Mother    Cancer Maternal Grandmother        UTERINE   Cancer Paternal Grandmother        SKIN   Hypertension Father    Healthy Sister    Healthy Brother    Healthy Sister    Healthy Sister    Healthy  Sister    Healthy Sister    Healthy Sister    Healthy Brother    Healthy Brother    Healthy Brother     Objective:  Blood pressure 122/77, pulse 90, SpO2 97 %. General: No acute distress.  Patient appears well-groomed.   Head:  Normocephalic/atraumatic Eyes:  fundi examined but not visualized Neck: supple, no paraspinal tenderness, full range of motion Back: No paraspinal tenderness Heart: regular rate and rhythm Lungs: Clear to auscultation bilaterally. Vascular: No carotid bruits. Neurological Exam: Mental status: alert and oriented to person, place, and time, recent and remote memory intact, fund of knowledge intact, attention and concentration intact, speech fluent and not  dysarthric, language intact. Cranial nerves: CN I: not tested CN II: pupils equal, round and reactive to light, visual fields intact CN III, IV, VI:  full range of motion, no nystagmus, no ptosis CN V: facial sensation intact. CN VII: upper and lower face symmetric CN VIII: hearing intact CN IX, X: gag intact, uvula midline CN XI: sternocleidomastoid and trapezius muscles intact CN XII: tongue midline Bulk & Tone: normal, no fasciculations. Motor:  muscle strength 5/5 throughout Sensation:  Pinprick, temperature and vibratory sensation intact. Deep Tendon Reflexes:  2+ throughout,  toes downgoing.   Finger to nose testing:  Without dysmetria.   Heel to shin:  Without dysmetria.   Gait:  Normal station and stride.  Romberg negative.    Thank you for allowing me to take part in the care of this patient.  Shon Millet, DO  CC:  Jackelyn Poling, DO  Darrin Nipper, MD

## 2021-04-07 ENCOUNTER — Ambulatory Visit: Payer: BC Managed Care – PPO | Admitting: Neurology

## 2021-04-07 ENCOUNTER — Other Ambulatory Visit: Payer: Self-pay

## 2021-04-07 ENCOUNTER — Encounter: Payer: Self-pay | Admitting: Neurology

## 2021-04-07 VITALS — BP 122/77 | HR 90

## 2021-04-07 DIAGNOSIS — M26609 Unspecified temporomandibular joint disorder, unspecified side: Secondary | ICD-10-CM

## 2021-04-07 DIAGNOSIS — M542 Cervicalgia: Secondary | ICD-10-CM

## 2021-04-07 NOTE — Patient Instructions (Addendum)
Creo que el dolor est relacionado con la disfuncin de la articulacin temporomandibular (la articulacin de la mandbula izquierda) y Chief Technology Officer de cuello. Lo derivar a fisioterapia para el dolor de cuello. Debe hacer un seguimiento con el dentista para el manejo de la disfuncin de la articulacin temporomandibular.

## 2021-04-20 ENCOUNTER — Other Ambulatory Visit: Payer: Self-pay

## 2021-04-20 ENCOUNTER — Ambulatory Visit (INDEPENDENT_AMBULATORY_CARE_PROVIDER_SITE_OTHER): Payer: BC Managed Care – PPO | Admitting: Rehabilitative and Restorative Service Providers"

## 2021-04-20 ENCOUNTER — Encounter: Payer: Self-pay | Admitting: Rehabilitative and Restorative Service Providers"

## 2021-04-20 DIAGNOSIS — M542 Cervicalgia: Secondary | ICD-10-CM | POA: Diagnosis not present

## 2021-04-20 DIAGNOSIS — R293 Abnormal posture: Secondary | ICD-10-CM | POA: Diagnosis not present

## 2021-04-20 NOTE — Therapy (Signed)
[facility] Physical Therapy [address] [place], [place], [address] Phone: [phone]   Fax:  [phone]  Physical Therapy Evaluation  Patient Details  Name: Dana Pope MRN: [id number] Date of Birth: 1977-10-45 Referring Provider (PT): Drema Dallas, DO   Encounter Date: 04/20/2021   PT End of Session - 04/20/21 1432     Visit Number 1    Number of Visits 20    Date for PT Re-Evaluation 06/29/21    Authorization Type BCBS $50 copay    Progress Note Due on Visit 10    PT Start Time 1435    PT Stop Time 1515    PT Time Calculation (min) 40 min    Activity Tolerance Patient tolerated treatment well    Behavior During Therapy [facility] for tasks assessed/performed             Past Medical History:  Diagnosis Date   Back pain    Migraine    Vision abnormalities     Past Surgical History:  Procedure Laterality Date   CESAREAN SECTION     X4   TUBAL LIGATION      There were no vitals filed for this visit.    Subjective Assessment - 04/20/21 1431     Subjective Pt. came to clinic c referral for cerfvical pain, headaches complaints, TMj dysfunction Lt.  Pt. indicated complaints in Lt jaw mostly with pain and locking in jaw at times and also hearing noise in ear at times like a tick as well.  Pt. stated complaints onset more than a year ago.   Pt. indicated no specific injury.  Saw dentist and was told she had a cavity that was severe.  Pain continued after treatment for cavity. Ended up changing dentist.No other treatments to this point.  Pt. reported having concerns about stress, anxiety. Pt. indicated feeling like she clinches at night, no bite guard discussed at this time.    Patient is accompained by: Interpreter    Diagnostic tests MRI 01/12/2021 small disc protrusions c3-4, c6-7 without compression.    Patient Stated Goals Reduce pain    Currently in Pain? Yes    Pain Score 8    pain at worst 7-8/10   Pain Location Jaw   neck   Pain  Orientation Left    Pain Descriptors / Indicators Aching;Sore;Tightness    Pain Type Chronic pain    Pain Onset More than a month ago    Pain Frequency Constant    Aggravating Factors  jaw movements, constant, locking of jaw, a/c and water, nighttime    Pain Relieving Factors some exercises from internet (neck movement)                [facility] PT Assessment - 04/20/21 0001       Assessment   Medical Diagnosis M54.2 (ICD-10-CM) - Cervicalgia    Referring Provider (PT) Drema Dallas, DO    Onset Date/Surgical Date 04/02/20   >1 year   Hand Dominance Right      Precautions   Precautions None      Balance Screen   Has the patient fallen in the past 6 months No    Has the patient had a decrease in activity level because of a fear of falling?  No    Is the patient reluctant to leave their home because of a fear of falling?  No      Prior Function   Vocation Requirements Driving 8 hours  Leisure Home activity/cleaning      Observation/Other Assessments   Focus on Therapeutic Outcomes (FOTO)  intake 51%, predicted 64%      Posture/Postural Control   Posture/Postural Control Postural limitations    Postural Limitations Forward head;Increased thoracic kyphosis;Rounded Shoulders      ROM / Strength   AROM / PROM / Strength AROM;PROM;Strength      AROM   Overall AROM Comments Lt lateral excursion limited moderately compard to Rt.  Rt lower jaw deviation in opening    AROM Assessment Site Cervical;Shoulder;Jaw    Cervical Flexion 61   pain reported in central and bilateral lower cervical   Cervical Extension 65   relief noted   Cervical - Right Rotation 62    Cervical - Left Rotation 60   Lt cervical pain (Upper trap)   Jaw-Incisal Opening  32 mm      Strength   Strength Assessment Site Shoulder;Elbow    Right/Left Shoulder Left;Right    Right/Left Elbow Right;Left      Palpation   Palpation comment Tenderness to touch in Lt TMJ.  Trigger points noted in Lt upper trap,  SCM, occipitals.  Crepitus noted in opening/closing on Lt TMJ.                        Objective measurements completed on examination: See above findings.       [facility] Adult PT Treatment/Exercise - 04/20/21 0001       Exercises   Exercises Other Exercises;Neck    Other Exercises  HEP instruction/performance c cues for techniques, handout provided.  Trial set performed of each for comprehension and symptom assessment.  Included supine UC flexion hold, scapular retraction, cervical retraction and Lt upper trap stretch      Manual Therapy   Manual therapy comments compression to Lt upper trap c movement                     PT Education - 04/20/21 1432     Education Details HEP, POC    Person(s) Educated Patient    Methods Explanation;Demonstration;Verbal cues;Handout    Comprehension Returned demonstration;Verbalized understanding              PT Short Term Goals - 04/20/21 1432       PT SHORT TERM GOAL #1   Title Patient will demonstrate independent use of home exercise program to maintain progress from in clinic treatments.    Time 3    Period Weeks    Status New    Target Date 05/11/21               PT Long Term Goals - 04/20/21 1433       PT LONG TERM GOAL #1   Title Patient will demonstrate/report pain at worst less than or equal to 2/10 to facilitate minimal limitation in daily activity secondary to pain symptoms.    Time 10    Period Weeks    Status New    Target Date 06/29/21      PT LONG TERM GOAL #2   Title Patient will demonstrate independent use of home exercise program to facilitate ability to maintain/progress functional gains from skilled physical therapy services.    Time 10    Period Weeks    Status New    Target Date 06/29/21      PT LONG TERM GOAL #3   Title Pt. will demonstrate FOTO outcome > or =  64 % to indicated reduced disability due to condition.    Time 10    Period Weeks    Status New    Target  Date 06/29/21      PT LONG TERM GOAL #4   Title Patient will demonstrate cervical AROM WFL s symptoms to facilitate daily activity including driving, self care at PLOF s limitation due to symptoms.    Time 10    Period Weeks    Status New    Target Date 06/29/21      PT LONG TERM GOAL #5   Title Pt. will demonstrate jaw mobility WFL c reduction in click/pop by 50% per Pt. report to facilitate usual mobility in daily life.    Time 10    Period Weeks    Status New    Target Date 06/29/21      Additional Long Term Goals   Additional Long Term Goals Yes      PT LONG TERM GOAL #6   Title Pt. will demonstrate/report ability to perform driving, sleeping s restriction due to symptoms.    Time 10    Period Weeks    Status New    Target Date 06/29/21                    Plan - 04/20/21 1518     Clinical Impression Statement Patient is a 45 y.o. who comes to clinic with complaints of cervical pain, Lt jaw pain with mobility and movement coordination deficits that impair their ability to perform usual daily and recreational functional activities without increase difficulty/symptoms at this time.  Patient to benefit from skilled PT services to address impairments and limitations to improve to previous level of function without restriction secondary to condition.    Examination-Activity Limitations Sleep;Caring for Others    Examination-Participation Restrictions Cleaning;Community Activity;Driving;Occupation;Meal Prep;Laundry;Interpersonal Relationship    Stability/Clinical Decision Making Stable/Uncomplicated    Clinical Decision Making Low    Rehab Potential Good    PT Frequency Other (comment)   1-2x/week   PT Duration Other (comment)   10 week   PT Treatment/Interventions ADLs/Self Care Home Management;Cryotherapy;Electrical Stimulation;Iontophoresis 4mg /ml Dexamethasone;Moist Heat;Traction;Balance training;Therapeutic exercise;Therapeutic activities;Functional mobility  training;Stair training;Gait training;Ultrasound;Neuromuscular re-education;Spinal Manipulations;Passive range of motion;Joint Manipulations;Dry needling;Taping;Manual techniques;Patient/family education    PT Next Visit Plan Continued myofascial release in Lt upper trap, SCM possible, jaw musculature/jaw mobs.    PT Home Exercise Plan [email or web address]    Consulted and Agree with Plan of Care Patient             Patient will benefit from skilled therapeutic intervention in order to improve the following deficits and impairments:  Decreased endurance, Hypomobility, Decreased activity tolerance, Pain, Increased fascial restricitons, Decreased mobility, Impaired perceived functional ability, Improper body mechanics, Postural dysfunction, Impaired flexibility, Decreased range of motion  Visit Diagnosis: Cervicalgia  Abnormal posture     Problem List Patient Active Problem List   Diagnosis Date Noted   Strain of right trapezius muscle 03/15/2018   Abdominal pain 01/11/2018   Fibrocystic breast changes of both breasts 12/09/2014   Toenail deformity 10/30/2014   DUB (dysfunctional uterine bleeding) 03/05/2014   Decreased libido 03/05/2014   Benign positional vertigo 12/21/2010   PALPITATIONS 08/03/2008   OVERWEIGHT 01/06/2008   DEPRESSION 01/06/2008   Back pain 01/06/2008   Headache 01/06/2008   03/08/2008, PT, DPT, OCS, ATC 04/20/21  3:40 PM    [facility] Physical Therapy [address] [place], [place], [place] Phone: [phone]  Fax:  [phone]  Name: DARCUS EDDS MRN: [id number] Date of Birth: [birth year]

## 2021-04-20 NOTE — Patient Instructions (Signed)
Access Code: [email or web address] URL: [email or web address] Date: 04/20/2021 Prepared by: Chyrel Masson  Exercises Supine Deep Neck Flexor Training - 1 x daily - 7 x weekly - 1 sets - 10 reps - 5 hold Seated Cervical Retraction - 1 x daily - 7 x weekly - 1 sets - 10 reps - 2-3 hold Seated Scapular Retraction - 1 x daily - 7 x weekly - 2 sets - 10 reps - 2-3 hold Seated Upper Trapezius Stretch (Mirrored) - 1 x daily - 7 x weekly - 1 sets - 5 reps - 15 hold

## 2021-04-25 ENCOUNTER — Ambulatory Visit (INDEPENDENT_AMBULATORY_CARE_PROVIDER_SITE_OTHER): Payer: BC Managed Care – PPO | Admitting: Physical Therapy

## 2021-04-25 ENCOUNTER — Other Ambulatory Visit: Payer: Self-pay

## 2021-04-25 ENCOUNTER — Encounter: Payer: Self-pay | Admitting: Physical Therapy

## 2021-04-25 DIAGNOSIS — R293 Abnormal posture: Secondary | ICD-10-CM

## 2021-04-25 DIAGNOSIS — M542 Cervicalgia: Secondary | ICD-10-CM | POA: Diagnosis not present

## 2021-04-25 NOTE — Therapy (Signed)
[facility] Physical Therapy [address] [place], [place], [address] Phone: [phone]   Fax:  [phone]  Physical Therapy Treatment  Patient Details  Name: Dana Pope MRN: [id number] Date of Birth: 09/16/1975 Referring Provider (PT): Drema Dallas, DO   Encounter Date: 04/25/2021   PT End of Session - 04/25/21 0924     Visit Number 2    Number of Visits 20    Date for PT Re-Evaluation 06/29/21    Authorization Type [employer] $50 copay    Progress Note Due on Visit 10    PT Start Time 0900   arrives late to 845 appt   PT Stop Time 0930    PT Time Calculation (min) 30 min    Activity Tolerance Patient tolerated treatment well    Behavior During Therapy [facility] for tasks assessed/performed             Past Medical History:  Diagnosis Date   Back pain    Migraine    Vision abnormalities     Past Surgical History:  Procedure Laterality Date   CESAREAN SECTION     X4   TUBAL LIGATION      There were no vitals filed for this visit.   Subjective Assessment - 04/25/21 0916     Subjective relays the pain is a little better, is about 3-4 today out of 10 in her upper traps and Lt massater and she is concerned with the noise of her jaw    Patient is accompained by: Interpreter    Diagnostic tests MRI 01/12/2021 small disc protrusions c3-4, c6-7 without compression.    Patient Stated Goals Reduce pain    Pain Onset More than a month ago                               [facility] Adult PT Treatment/Exercise - 04/25/21 0001       Neck Exercises: Supine   Other Supine Exercise rocabado 6xX6 program (controlled opening opening of jaw while maintaining resting postition of tounge on roof of mounth, cervical isometrics lateral jaw devaition to Lt scapular retractions, Upper cervical flexion, and chin tucks) all 6 second holds 6 reps ea      Neck Exercises: Stretches   Other Neck Stretches sub occipital release on tennis balls 3 min,  gave her these to take home to add.  Also performed upper trap stretch 6 sec X 6 reps ea side     Manual Therapy   Manual therapy comments STM to massater and compression with active jaw opening and closing, Subocciptial release with gentle cervical traction                       PT Short Term Goals - 04/20/21 1432       PT SHORT TERM GOAL #1   Title Patient will demonstrate independent use of home exercise program to maintain progress from in clinic treatments.    Time 3    Period Weeks    Status New    Target Date 05/11/21               PT Long Term Goals - 04/20/21 1433       PT LONG TERM GOAL #1   Title Patient will demonstrate/report pain at worst less than or equal to 2/10 to facilitate minimal limitation in daily activity secondary to pain symptoms.    Time 10  Period Weeks    Status New    Target Date 06/29/21      PT LONG TERM GOAL #2   Title Patient will demonstrate independent use of home exercise program to facilitate ability to maintain/progress functional gains from skilled physical therapy services.    Time 10    Period Weeks    Status New    Target Date 06/29/21      PT LONG TERM GOAL #3   Title Pt. will demonstrate FOTO outcome > or = 64 % to indicated reduced disability due to condition.    Time 10    Period Weeks    Status New    Target Date 06/29/21      PT LONG TERM GOAL #4   Title Patient will demonstrate cervical AROM WFL s symptoms to facilitate daily activity including driving, self care at PLOF s limitation due to symptoms.    Time 10    Period Weeks    Status New    Target Date 06/29/21      PT LONG TERM GOAL #5   Title Pt. will demonstrate jaw mobility WFL c reduction in click/pop by 50% per Pt. report to facilitate usual mobility in daily life.    Time 10    Period Weeks    Status New    Target Date 06/29/21      Additional Long Term Goals   Additional Long Term Goals Yes      PT LONG TERM GOAL #6   Title  Pt. will demonstrate/report ability to perform driving, sleeping s restriction due to symptoms.    Time 10    Period Weeks    Status New    Target Date 06/29/21                   Plan - 04/25/21 0926     Clinical Impression Statement Shorter session as she arrives late today, she is getting some initial early benefit of HEP, today we implemented robabodo TMJ exercise program consisting of resting position of tounge on roof of mouth and add the other Rocabodo TMJ exercises consisting of controlled opening (opening of jaw while maintaining resting postition of tounge on roof of mounth, and cervical isometrics. 6 reps for 6 seconds ea, 6 times per day is recommended for her.    Examination-Activity Limitations Sleep;Caring for Others    Examination-Participation Restrictions Cleaning;Community Activity;Driving;Occupation;Meal Prep;Laundry;Interpersonal Relationship    Stability/Clinical Decision Making Stable/Uncomplicated    Rehab Potential Good    PT Frequency Other (comment)   1-2x/week   PT Duration Other (comment)   10 week   PT Treatment/Interventions ADLs/Self Care Home Management;Cryotherapy;Electrical Stimulation;Iontophoresis 4mg /ml Dexamethasone;Moist Heat;Traction;Balance training;Therapeutic exercise;Therapeutic activities;Functional mobility training;Stair training;Gait training;Ultrasound;Neuromuscular re-education;Spinal Manipulations;Passive range of motion;Joint Manipulations;Dry needling;Taping;Manual techniques;Patient/family education    PT Next Visit Plan how is HEP and rocabado program going, Continued myofascial release and or DN in Lt upper trap, SCM possible, jaw musculature/jaw mobs.    PT Home Exercise Plan [email or web address]    Consulted and Agree with Plan of Care Patient             Patient will benefit from skilled therapeutic intervention in order to improve the following deficits and impairments:  Decreased endurance, Hypomobility, Decreased activity  tolerance, Pain, Increased fascial restricitons, Decreased mobility, Impaired perceived functional ability, Improper body mechanics, Postural dysfunction, Impaired flexibility, Decreased range of motion  Visit Diagnosis: Cervicalgia  Abnormal posture     Problem List Patient Active Problem List  Diagnosis Date Noted   Strain of right trapezius muscle 03/15/2018   Abdominal pain 01/11/2018   Fibrocystic breast changes of both breasts 12/09/2014   Toenail deformity 10/30/2014   DUB (dysfunctional uterine bleeding) 03/05/2014   Decreased libido 03/05/2014   Benign positional vertigo 12/21/2010   PALPITATIONS 08/03/2008   OVERWEIGHT 01/06/2008   DEPRESSION 01/06/2008   Back pain 01/06/2008   Headache 01/06/2008    April Manson, PT 04/25/2021, 9:29 AM  [facility] Physical Therapy [address] [place], [place], [address] Phone: [phone]   Fax:  [phone]  Name: Dana Pope MRN: [id number] Date of Birth: 1976/06/04

## 2021-04-29 ENCOUNTER — Encounter: Payer: BC Managed Care – PPO | Admitting: Physical Therapy

## 2021-05-04 ENCOUNTER — Other Ambulatory Visit: Payer: Self-pay

## 2021-05-04 ENCOUNTER — Ambulatory Visit (INDEPENDENT_AMBULATORY_CARE_PROVIDER_SITE_OTHER): Payer: BC Managed Care – PPO | Admitting: Rehabilitative and Restorative Service Providers"

## 2021-05-04 ENCOUNTER — Encounter: Payer: Self-pay | Admitting: Rehabilitative and Restorative Service Providers"

## 2021-05-04 DIAGNOSIS — M542 Cervicalgia: Secondary | ICD-10-CM | POA: Diagnosis not present

## 2021-05-04 DIAGNOSIS — R293 Abnormal posture: Secondary | ICD-10-CM

## 2021-05-04 NOTE — Patient Instructions (Signed)
Access Code: [email or web address] URL: [email or web address] Date: 05/04/2021 Prepared by: Chyrel Masson  Exercises Supine Deep Neck Flexor Training - 1 x daily - 7 x weekly - 1 sets - 10 reps - 5 hold Seated Cervical Retraction - 1 x daily - 7 x weekly - 1 sets - 10 reps - 2-3 hold Seated Scapular Retraction - 1 x daily - 7 x weekly - 2 sets - 10 reps - 2-3 hold Seated Upper Trapezius Stretch (Mirrored) - 2-3 x daily - 7 x weekly - 1 sets - 5 reps - 15 hold Isometric Jaw Abduction - 6 x daily - 7 x weekly - 1 sets - 6 reps - 5 hold Isometric Jaw Adduction - 6 x daily - 7 x weekly - 1 sets - 6 reps - 5 hold Isometric Jaw Deviation - 6 x daily - 7 x weekly - 1 sets - 6 reps - 5 hold

## 2021-05-04 NOTE — Therapy (Signed)
[facility] Physical Therapy [address] [place], [place], [address] Phone: [phone]   Fax:  [phone]  Physical Therapy Treatment  Patient Details  Name: Dana Pope MRN: [id number] Date of Birth: 06-Aug-1975 Referring Provider (PT): Drema Dallas, DO   Encounter Date: 05/04/2021   PT End of Session - 05/04/21 1342     Visit Number 3    Number of Visits 20    Date for PT Re-Evaluation 06/29/21    Authorization Type BC[employer] $50 copay    Progress Note Due on Visit 10    PT Start Time 1342    PT Stop Time 1423    PT Time Calculation (min) 41 min    Activity Tolerance Patient tolerated treatment well    Behavior During Therapy [facility] for tasks assessed/performed             Past Medical History:  Diagnosis Date   Back pain    Migraine    Vision abnormalities     Past Surgical History:  Procedure Laterality Date   CESAREAN SECTION     X4   TUBAL LIGATION      There were no vitals filed for this visit.   Subjective Assessment - 05/04/21 1345     Subjective Pt. indicated feeling better than previously.  Pt. indicated having some muscular complaints and lower neck pain region (not sure if exercise or not sleeping well).  Pt. indicated clicking still noted.    Patient is accompained by: Interpreter    Diagnostic tests MRI 01/12/2021 small disc protrusions c3-4, c6-7 without compression.    Patient Stated Goals Reduce pain    Currently in Pain? Yes    Pain Score 4     Pain Location Jaw    Pain Orientation Left    Pain Type Chronic pain    Pain Onset More than a month ago    Pain Frequency Constant    Aggravating Factors  jaw movements, clicking    Pain Relieving Factors some improvement overall.                [facility] PT Assessment - 05/04/21 0001       Assessment   Medical Diagnosis M54.2 (ICD-10-CM) - Cervicalgia    Referring Provider (PT) Drema Dallas, DO    Onset Date/Surgical Date 04/02/20    Hand Dominance Right       AROM   Cervical Flexion 62   no complaints   Cervical Extension 69   no complaints   Jaw-Incisal Opening  38 mm                           [facility] Adult PT Treatment/Exercise - 05/04/21 0001       Exercises   Other Exercises  HEP printout review      Neck Exercises: Standing   Other Standing Exercises tband rows green 3 x 10, gh ext 3 x 10      Neck Exercises: Supine   Other Supine Exercise Rocabado 6 program (isometric jaw open/close, lateral translation 5 sec hold x 6, supine uc flexion 5 sec hold x 6)      Neck Exercises: Stretches   Upper Trapezius Stretch 5 reps;Left   15 seconds hold     Manual Therapy   Manual therapy comments upper trap Lt compression,  compresison c active jaw opening Lt masseter  Trigger Point Dry Needling - 05/04/21 0001     Consent Given? Yes    Education Handout Provided No   Education verbally through interpreter   Muscles Treated Head and Neck Upper trapezius   Lt   Upper Trapezius Response Twitch reponse elicited                   PT Education - 05/04/21 1411     Education Details HEP progression (jaw isometric additions)    Person(s) Educated Patient    Methods Explanation;Demonstration;Verbal cues;Handout    Comprehension Returned demonstration;Verbalized understanding              PT Short Term Goals - 05/04/21 1411       PT SHORT TERM GOAL #1   Title Patient will demonstrate independent use of home exercise program to maintain progress from in clinic treatments.    Time 3    Period Weeks    Status On-going    Target Date 05/11/21               PT Long Term Goals - 04/20/21 1433       PT LONG TERM GOAL #1   Title Patient will demonstrate/report pain at worst less than or equal to 2/10 to facilitate minimal limitation in daily activity secondary to pain symptoms.    Time 10    Period Weeks    Status New    Target Date 06/29/21      PT LONG TERM GOAL #2   Title  Patient will demonstrate independent use of home exercise program to facilitate ability to maintain/progress functional gains from skilled physical therapy services.    Time 10    Period Weeks    Status New    Target Date 06/29/21      PT LONG TERM GOAL #3   Title Pt. will demonstrate FOTO outcome > or = 64 % to indicated reduced disability due to condition.    Time 10    Period Weeks    Status New    Target Date 06/29/21      PT LONG TERM GOAL #4   Title Patient will demonstrate cervical AROM WFL s symptoms to facilitate daily activity including driving, self care at PLOF s limitation due to symptoms.    Time 10    Period Weeks    Status New    Target Date 06/29/21      PT LONG TERM GOAL #5   Title Pt. will demonstrate jaw mobility WFL c reduction in click/pop by 50% per Pt. report to facilitate usual mobility in daily life.    Time 10    Period Weeks    Status New    Target Date 06/29/21      Additional Long Term Goals   Additional Long Term Goals Yes      PT LONG TERM GOAL #6   Title Pt. will demonstrate/report ability to perform driving, sleeping s restriction due to symptoms.    Time 10    Period Weeks    Status New    Target Date 06/29/21                   Plan - 05/04/21 1411     Clinical Impression Statement Addition of dry needling today in clinic to release Lt upper trap c fair to good response and tolerance.  Application of jaw isometrics for improved muscular control for HEP. Continued skilled PT services indicated at this time.  Examination-Activity Limitations Sleep;Caring for Others    Examination-Participation Restrictions Cleaning;Community Activity;Driving;Occupation;Meal Prep;Laundry;Interpersonal Relationship    Stability/Clinical Decision Making Stable/Uncomplicated    Rehab Potential Good    PT Frequency Other (comment)   1-2x/week   PT Duration Other (comment)   10 week   PT Treatment/Interventions ADLs/Self Care Home  Management;Cryotherapy;Electrical Stimulation;Iontophoresis 4mg /ml Dexamethasone;Moist Heat;Traction;Balance training;Therapeutic exercise;Therapeutic activities;Functional mobility training;Stair training;Gait training;Ultrasound;Neuromuscular re-education;Spinal Manipulations;Passive range of motion;Joint Manipulations;Dry needling;Taping;Manual techniques;Patient/family education    PT Next Visit Plan Dry needling as tolerated, continued postural strengthening and jaw movement.    PT Home Exercise Plan [id number]    Consulted and Agree with Plan of Care Patient             Patient will benefit from skilled therapeutic intervention in order to improve the following deficits and impairments:  Decreased endurance, Hypomobility, Decreased activity tolerance, Pain, Increased fascial restricitons, Decreased mobility, Impaired perceived functional ability, Improper body mechanics, Postural dysfunction, Impaired flexibility, Decreased range of motion  Visit Diagnosis: Cervicalgia  Abnormal posture     Problem List Patient Active Problem List   Diagnosis Date Noted   Strain of right trapezius muscle 03/15/2018   Abdominal pain 01/11/2018   Fibrocystic breast changes of both breasts 12/09/2014   Toenail deformity 10/30/2014   DUB (dysfunctional uterine bleeding) 03/05/2014   Decreased libido 03/05/2014   Benign positional vertigo 12/21/2010   PALPITATIONS 08/03/2008   OVERWEIGHT 01/06/2008   DEPRESSION 01/06/2008   Back pain 01/06/2008   Headache 01/06/2008    03/08/2008, PT, DPT, OCS, ATC 05/04/21  2:21 PM    [facility] Physical Therapy [address] [place], [place], [place] Phone: [phone]   Fax:  [phone]  Name: Dana Pope MRN: Lilian Kapur Date of Birth: 15-Sep-1975

## 2021-05-06 ENCOUNTER — Encounter: Payer: BC Managed Care – PPO | Admitting: Rehabilitative and Restorative Service Providers"

## 2021-05-10 ENCOUNTER — Ambulatory Visit (INDEPENDENT_AMBULATORY_CARE_PROVIDER_SITE_OTHER): Payer: BC Managed Care – PPO | Admitting: Physical Therapy

## 2021-05-10 ENCOUNTER — Other Ambulatory Visit: Payer: Self-pay

## 2021-05-10 DIAGNOSIS — M542 Cervicalgia: Secondary | ICD-10-CM | POA: Diagnosis not present

## 2021-05-10 DIAGNOSIS — R293 Abnormal posture: Secondary | ICD-10-CM

## 2021-05-10 NOTE — Therapy (Signed)
[facility] Physical Therapy [address] [place], [place], [address] Phone: [phone]   Fax:  [phone]  Physical Therapy Treatment  Patient Details  Name: Dana Pope Noss MRN: [id number] Date of Birth: 24-Apr-1976 Referring Provider (PT): Drema Dallas, DO   Encounter Date: 05/10/2021   PT End of Session - 05/10/21 1348     Visit Number 4    Number of Visits 20    Date for PT Re-Evaluation 06/29/21    Authorization Type [employer] $50 copay    Progress Note Due on Visit 10    PT Start Time 1345    PT Stop Time 1430    PT Time Calculation (min) 45 min    Activity Tolerance Patient tolerated treatment well    Behavior During Therapy [facility] for tasks assessed/performed             Past Medical History:  Diagnosis Date   Back pain    Migraine    Vision abnormalities     Past Surgical History:  Procedure Laterality Date   CESAREAN SECTION     X4   TUBAL LIGATION      There were no vitals filed for this visit.   Subjective Assessment - 05/10/21 1350     Subjective Has been doing exercises daily. Notes some pain when laying down at night but currently no pain right now.    Patient is accompained by: Interpreter    Diagnostic tests MRI 01/12/2021 small disc protrusions c3-4, c6-7 without compression.    Patient Stated Goals Reduce pain    Currently in Pain? No/denies    Pain Onset More than a month ago                               [facility] Adult PT Treatment/Exercise - 05/10/21 0001       Neck Exercises: Standing   Other Standing Exercises shoulder abduction 2x10 green tband    Other Standing Exercises low trap setting 2x10      Neck Exercises: Seated   Money 10 reps    Money Limitations green tband      Neck Exercises: Supine   Other Supine Exercise Rocabado 6 program (isometric jaw open/close, lateral translation 5 sec hold x 6, supine uc flexion 5 sec hold x 6)      Neck Exercises: Sidelying   Other Sidelying  Exercise Jaw opening focus on only condylar rotation x10 L and R   reports decrease in the noise her ear     Neck Exercises: Stretches   Upper Trapezius Stretch 30 seconds;2 reps;Left    Other Neck Stretches Scalene stretch 2x30 sec      Manual Therapy   Manual Therapy Manual Traction    Manual therapy comments STM & TPR upper trap Lt compression,  L masseter,  scalene, suboccipitals    Manual Traction cervical traction 3 x 10 sec                       PT Short Term Goals - 05/04/21 1411       PT SHORT TERM GOAL #1   Title Patient will demonstrate independent use of home exercise program to maintain progress from in clinic treatments.    Time 3    Period Weeks    Status On-going    Target Date 05/11/21  PT Long Term Goals - 04/20/21 1433       PT LONG TERM GOAL #1   Title Patient will demonstrate/report pain at worst less than or equal to 2/10 to facilitate minimal limitation in daily activity secondary to pain symptoms.    Time 10    Period Weeks    Status New    Target Date 06/29/21      PT LONG TERM GOAL #2   Title Patient will demonstrate independent use of home exercise program to facilitate ability to maintain/progress functional gains from skilled physical therapy services.    Time 10    Period Weeks    Status New    Target Date 06/29/21      PT LONG TERM GOAL #3   Title Pt. will demonstrate FOTO outcome > or = 64 % to indicated reduced disability due to condition.    Time 10    Period Weeks    Status New    Target Date 06/29/21      PT LONG TERM GOAL #4   Title Patient will demonstrate cervical AROM WFL s symptoms to facilitate daily activity including driving, self care at PLOF s limitation due to symptoms.    Time 10    Period Weeks    Status New    Target Date 06/29/21      PT LONG TERM GOAL #5   Title Pt. will demonstrate jaw mobility WFL c reduction in click/pop by 50% per Pt. report to facilitate usual mobility in  daily life.    Time 10    Period Weeks    Status New    Target Date 06/29/21      Additional Long Term Goals   Additional Long Term Goals Yes      PT LONG TERM GOAL #6   Title Pt. will demonstrate/report ability to perform driving, sleeping s restriction due to symptoms.    Time 10    Period Weeks    Status New    Target Date 06/29/21                   Plan - 05/10/21 1413     Clinical Impression Statement Continued to provide manual therapy for neck and shoulder. Reviewed jaw isometrics with pt maintaining good form. Discussed using theracane to continue to decrease her UT trigger point at home. Initiated shoulder strengthening for posture. Difficulty performing Y without UT compensation.    Examination-Activity Limitations Sleep;Caring for Others    Examination-Participation Restrictions Cleaning;Community Activity;Driving;Occupation;Meal Prep;Laundry;Interpersonal Relationship    Stability/Clinical Decision Making Stable/Uncomplicated    Rehab Potential Good    PT Frequency Other (comment)   1-2x/week   PT Duration Other (comment)   10 week   PT Treatment/Interventions ADLs/Self Care Home Management;Cryotherapy;Electrical Stimulation;Iontophoresis 4mg /ml Dexamethasone;Moist Heat;Traction;Balance training;Therapeutic exercise;Therapeutic activities;Functional mobility training;Stair training;Gait training;Ultrasound;Neuromuscular re-education;Spinal Manipulations;Passive range of motion;Joint Manipulations;Dry needling;Taping;Manual techniques;Patient/family education    PT Next Visit Plan Dry needling as tolerated, continued postural strengthening and jaw movement.    PT Home Exercise Plan [id number]    Consulted and Agree with Plan of Care Patient             Patient will benefit from skilled therapeutic intervention in order to improve the following deficits and impairments:  Decreased endurance, Hypomobility, Decreased activity tolerance, Pain, Increased fascial  restricitons, Decreased mobility, Impaired perceived functional ability, Improper body mechanics, Postural dysfunction, Impaired flexibility, Decreased range of motion  Visit Diagnosis: Cervicalgia  Abnormal posture  Problem List Patient Active Problem List   Diagnosis Date Noted   Strain of right trapezius muscle 03/15/2018   Abdominal pain 01/11/2018   Fibrocystic breast changes of both breasts 12/09/2014   Toenail deformity 10/30/2014   DUB (dysfunctional uterine bleeding) 03/05/2014   Decreased libido 03/05/2014   Benign positional vertigo 12/21/2010   PALPITATIONS 08/03/2008   OVERWEIGHT 01/06/2008   DEPRESSION 01/06/2008   Back pain 01/06/2008   Headache 01/06/2008    [facility] [address] [place], [place], DPT 05/10/2021, 4:11 PM  [facility] Physical Therapy [address] [place], [place], [address] Phone: [phone]   Fax:  [phone]  Name: SHADEN HIGLEY MRN: [id number] Date of Birth: 25-Oct-1975

## 2021-05-20 ENCOUNTER — Ambulatory Visit (INDEPENDENT_AMBULATORY_CARE_PROVIDER_SITE_OTHER): Payer: BC Managed Care – PPO | Admitting: Physical Therapy

## 2021-05-20 ENCOUNTER — Other Ambulatory Visit: Payer: Self-pay

## 2021-05-20 DIAGNOSIS — M542 Cervicalgia: Secondary | ICD-10-CM | POA: Diagnosis not present

## 2021-05-20 DIAGNOSIS — R293 Abnormal posture: Secondary | ICD-10-CM | POA: Diagnosis not present

## 2021-05-20 NOTE — Therapy (Signed)
[facility] Physical Therapy [address] [place], [place], [address] Phone: [phone]   Fax:  [phone]  Physical Therapy Treatment  Patient Details  Name: Dana Pope MRN: [id number] Date of Birth: 07-06-1975 Referring Provider (PT): Drema Dallas, DO   Encounter Date: 05/20/2021   PT End of Session - 05/20/21 1056     Visit Number 5    Number of Visits 20    Date for PT Re-Evaluation 06/29/21    Authorization Type [employer] $50 copay    Progress Note Due on Visit 10    PT Start Time 1015    PT Stop Time 1055    PT Time Calculation (min) 40 min    Activity Tolerance Patient tolerated treatment well    Behavior During Therapy [facility] for tasks assessed/performed             Past Medical History:  Diagnosis Date   Back pain    Migraine    Vision abnormalities     Past Surgical History:  Procedure Laterality Date   CESAREAN SECTION     X4   TUBAL LIGATION      There were no vitals filed for this visit.   Subjective Assessment - 05/20/21 1040     Subjective Has been doing the exercises and her jaw pain is much better, only pain she has today is more in her upper trap and she still feels knots in her upper trap/scalene area    Patient is accompained by: Interpreter    Diagnostic tests MRI 01/12/2021 small disc protrusions c3-4, c6-7 without compression.    Patient Stated Goals Reduce pain    Pain Onset More than a month ago                               [facility] Adult PT Treatment/Exercise - 05/20/21 0001       Neck Exercises: Supine   Upper Extremity D2 15 reps    UE D2 Limitations 2    Other Supine Exercise chin tucks X15 holding 3 sec, chin tucks with cervical extension X15,H abduction with green 3X10, bilat ER with scap retraction green 3X10      Neck Exercises: Stretches   Upper Trapezius Stretch Left;3 reps;30 seconds    Other Neck Stretches Scalene stretch on left 3X20 sec      Manual Therapy   Manual  therapy comments STM with compression and skilled palaption & TPR with DN to upper trap Lt  L scalene, suboccipitals,paraspinals              Trigger Point Dry Needling - 05/20/21 0001     Consent Given? Yes    Education Handout Provided --   education reinforced with interpreter   Muscles Treated Head and Neck Upper trapezius;Suboccipitals;Cervical multifidi    Dry Needling Comments twitch response noted, good overall tolerance                     PT Short Term Goals - 05/04/21 1411       PT SHORT TERM GOAL #1   Title Patient will demonstrate independent use of home exercise program to maintain progress from in clinic treatments.    Time 3    Period Weeks    Status On-going    Target Date 05/11/21               PT Long Term Goals - 04/20/21 1433  PT LONG TERM GOAL #1   Title Patient will demonstrate/report pain at worst less than or equal to 2/10 to facilitate minimal limitation in daily activity secondary to pain symptoms.    Time 10    Period Weeks    Status New    Target Date 06/29/21      PT LONG TERM GOAL #2   Title Patient will demonstrate independent use of home exercise program to facilitate ability to maintain/progress functional gains from skilled physical therapy services.    Time 10    Period Weeks    Status New    Target Date 06/29/21      PT LONG TERM GOAL #3   Title Pt. will demonstrate FOTO outcome > or = 64 % to indicated reduced disability due to condition.    Time 10    Period Weeks    Status New    Target Date 06/29/21      PT LONG TERM GOAL #4   Title Patient will demonstrate cervical AROM WFL s symptoms to facilitate daily activity including driving, self care at PLOF s limitation due to symptoms.    Time 10    Period Weeks    Status New    Target Date 06/29/21      PT LONG TERM GOAL #5   Title Pt. will demonstrate jaw mobility WFL c reduction in click/pop by 50% per Pt. report to facilitate usual mobility in  daily life.    Time 10    Period Weeks    Status New    Target Date 06/29/21      Additional Long Term Goals   Additional Long Term Goals Yes      PT LONG TERM GOAL #6   Title Pt. will demonstrate/report ability to perform driving, sleeping s restriction due to symptoms.    Time 10    Period Weeks    Status New    Target Date 06/29/21                   Plan - 05/20/21 1056     Clinical Impression Statement She was not having jaw pain so focused on her neck pain, tightness, and triggerpoints with manual therapy and DN followed by stretching and strengthening program with good overall tolerance to this and less pain/tightness after session. Continue POC.    Examination-Activity Limitations Sleep;Caring for Others    Examination-Participation Restrictions Cleaning;Community Activity;Driving;Occupation;Meal Prep;Laundry;Interpersonal Relationship    Stability/Clinical Decision Making Stable/Uncomplicated    Rehab Potential Good    PT Frequency Other (comment)   1-2x/week   PT Duration Other (comment)   10 week   PT Treatment/Interventions ADLs/Self Care Home Management;Cryotherapy;Electrical Stimulation;Iontophoresis 4mg /ml Dexamethasone;Moist Heat;Traction;Balance training;Therapeutic exercise;Therapeutic activities;Functional mobility training;Stair training;Gait training;Ultrasound;Neuromuscular re-education;Spinal Manipulations;Passive range of motion;Joint Manipulations;Dry needling;Taping;Manual techniques;Patient/family education    PT Next Visit Plan Dry needling as tolerated, continued postural strengthening, upper trap/scalene work, and jaw movement if needed    PT Home Exercise Plan [id number]    Consulted and Agree with Plan of Care Patient             Patient will benefit from skilled therapeutic intervention in order to improve the following deficits and impairments:  Decreased endurance, Hypomobility, Decreased activity tolerance, Pain, Increased fascial  restricitons, Decreased mobility, Impaired perceived functional ability, Improper body mechanics, Postural dysfunction, Impaired flexibility, Decreased range of motion  Visit Diagnosis: Cervicalgia  Abnormal posture     Problem List Patient Active Problem List   Diagnosis Date  Noted   Strain of right trapezius muscle 03/15/2018   Abdominal pain 01/11/2018   Fibrocystic breast changes of both breasts 12/09/2014   Toenail deformity 10/30/2014   DUB (dysfunctional uterine bleeding) 03/05/2014   Decreased libido 03/05/2014   Benign positional vertigo 12/21/2010   PALPITATIONS 08/03/2008   OVERWEIGHT 01/06/2008   DEPRESSION 01/06/2008   Back pain 01/06/2008   Headache 01/06/2008    April Manson, PT,DPT 05/20/2021, 10:58 AM  [facility] Physical Therapy [address] [place], [place], [address] Phone: [phone]   Fax:  [phone]  Name: Dana Pope MRN: [id number] Date of Birth: 04/10/1976

## 2021-05-25 ENCOUNTER — Encounter: Payer: Self-pay | Admitting: Rehabilitative and Restorative Service Providers"

## 2021-05-25 ENCOUNTER — Other Ambulatory Visit: Payer: Self-pay

## 2021-05-25 ENCOUNTER — Ambulatory Visit (INDEPENDENT_AMBULATORY_CARE_PROVIDER_SITE_OTHER): Payer: BC Managed Care – PPO | Admitting: Rehabilitative and Restorative Service Providers"

## 2021-05-25 DIAGNOSIS — M542 Cervicalgia: Secondary | ICD-10-CM | POA: Diagnosis not present

## 2021-05-25 DIAGNOSIS — R293 Abnormal posture: Secondary | ICD-10-CM | POA: Diagnosis not present

## 2021-05-25 NOTE — Therapy (Addendum)
[facility] Physical Therapy [address] [place], [place], [address] Phone: [phone]   Fax:  [phone]  Physical Therapy Treatment /Discharge   Patient Details  Name: Dana Pope MRN: [id number] Date of Birth: 07-Oct-1975 Referring Provider (PT): Pieter Partridge, DO  Progress Note Reporting Period 1019/2022 to 05/25/2021  See note below for Objective Data and Assessment of Progress/Goals.     Encounter Date: 05/25/2021   PT End of Session - 05/25/21 1646     Visit Number 6    Number of Visits 20    Date for PT Re-Evaluation 06/29/21    Authorization Type [employer] $50 copay    Progress Note Due on Visit 10    PT Start Time 5643    PT Stop Time 1430    PT Time Calculation (min) 45 min    Activity Tolerance Patient tolerated treatment well;No increased pain;Patient limited by fatigue    Behavior During Therapy [facility] for tasks assessed/performed             Past Medical History:  Diagnosis Date   Back pain    Migraine    Vision abnormalities     Past Surgical History:  Procedure Laterality Date   CESAREAN SECTION     X4   TUBAL LIGATION      There were no vitals filed for this visit.   Subjective Assessment - 05/25/21 1342     Subjective Dana Pope is happy with her overall progress.  L sided neck and scapular pain later in the day and with prolonged sitting appears postural and due to weakness.    Patient is accompained by: Interpreter    How long can you sit comfortably? 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    Patient Stated Goals Reduce pain    Currently in Pain? Yes    Pain Score 3     Pain Location Neck    Pain Orientation Left    Pain Descriptors / Indicators Aching;Sore;Tightness    Pain Type Chronic pain    Pain Radiating Towards Upper trapezius, levator scapulae, mid-scapulae    Pain Onset More  than a month ago    Pain Frequency Constant    Aggravating Factors  Prolonged sitting and flexed postures    Pain Relieving Factors Change of position    Effect of Pain on Daily Activities Feels "wiped out" after work, affecting function at home with family                [facility] PT Assessment - 05/25/21 0001       ROM / Strength   AROM / PROM / Strength Strength      Strength   Overall Strength Deficits    Strength Assessment Site Cervical;Lumbar    Cervical Extension --   19.0 pounds   Cervical - Right Side Bend --   15.5 pounds   Cervical - Left Side Bend --   11.5 pounds   Lumbar Extension --   18 seconds spine strength test (60 seconds goal)                          [facility] Adult PT Treatment/Exercise - 05/25/21 0001       Therapeutic Activites    Therapeutic Activities Other Therapeutic Activities    Other Therapeutic Activities Reviewed imaging and spine  anatomy per Dana Pope's request.  Answered questions about her condition and made recommendations to help with fatigue/pain at work (short breaks to do new exercises, use a lumbar roll, posture with driving)      Exercises   Exercises Neck      Neck Exercises: Standing   Other Standing Exercises Shoulder blade pinches 10X 5 seconds      Neck Exercises: Seated   Cervical Isometrics Extension;Left lateral flexion;Right lateral flexion;5 secs;10 reps;Limitations    Cervical Isometrics Limitations 2 sets extension and 1 set lateral bending      Neck Exercises: Prone   Other Prone Exercise Prone alternating arm and leg extension (palms in, head in neutral, toes pulled back) 2 sets of 10 for 3 seconds    Other Prone Exercise Prone T (shoulder 90 degrees abduction, full ER) lift arms and legs 10X 3 seconds 2 sets                     PT Education - 05/25/21 1641     Education Details Reviewed imaging per Dana Pope's request.  Added new postural, cervical and scapular strengthening activities  to address late day soreness.    Person(s) Educated Patient    Methods Explanation;Demonstration;Tactile cues;Verbal cues;Handout    Comprehension Verbalized understanding;Tactile cues required;Need further instruction;Returned demonstration;Verbal cues required              PT Short Term Goals - 05/25/21 1643       PT SHORT TERM GOAL #1   Title Patient will demonstrate independent use of home exercise program to maintain progress from in clinic treatments.    Time 3    Period Weeks    Status Achieved    Target Date 05/11/21               PT Long Term Goals - 05/25/21 1643       PT LONG TERM GOAL #1   Title Patient will demonstrate/report pain at worst less than or equal to 2/10 to facilitate minimal limitation in daily activity secondary to pain symptoms.    Baseline Can be 4/10 by the end of the day    Time 10    Period Weeks    Status On-going    Target Date 06/29/21      PT LONG TERM GOAL #2   Title Patient will demonstrate independent use of home exercise program to facilitate ability to maintain/progress functional gains from skilled physical therapy services.    Time 10    Period Weeks    Status On-going    Target Date 06/29/21      PT LONG TERM GOAL #3   Title Pt. will demonstrate FOTO outcome > or = 64 % to indicated reduced disability due to condition.    Baseline Given to patient but appraently she didn't complete it 05/25/2021.    Time 10    Period Weeks    Status On-going    Target Date 06/29/21      PT LONG TERM GOAL #4   Title Patient will demonstrate cervical AROM WFL s symptoms to facilitate daily activity including driving, self care at PLOF s limitation due to symptoms.    Time 10    Period Weeks    Status Achieved      PT LONG TERM GOAL #5   Title Pt. will demonstrate jaw mobility WFL c reduction in click/pop by 50% per Pt. report to facilitate usual mobility in daily life.      Time 10    Period Weeks    Status Achieved      PT LONG  TERM GOAL #6   Title Pt. will demonstrate/report ability to perform driving, sleeping s restriction due to symptoms.    Baseline Driving limited late in her shift.    Time 10    Period Weeks    Status On-going    Target Date 06/29/21                   Plan - 05/25/21 1646     Clinical Impression Statement Dana Pope is happy with her overall progress.  She reports minimal stiffness or jaw pain.  Late day fatigue, postural issues and weaknesses are contributing to her L sided neck and scapular pain at the end of the day.  This was addressed after assessment today.  Dana Pope with continued PT.  I expect her to meet LTGs by the end of her current POC (06/29/2021).    Examination-Activity Limitations Sleep;Caring for Others    Examination-Participation Restrictions Cleaning;Community Activity;Driving;Occupation;Meal Prep;Laundry;Interpersonal Relationship    Stability/Clinical Decision Making Stable/Uncomplicated    Rehab Potential Pope    PT Frequency Other (comment)   1-2x/week   PT Duration Other (comment)   10 week   PT Treatment/Interventions ADLs/Self Care Home Management;Cryotherapy;Electrical Stimulation;Iontophoresis 50m/ml Dexamethasone;Moist Heat;Traction;Balance training;Therapeutic exercise;Therapeutic activities;Functional mobility training;Stair training;Gait training;Ultrasound;Neuromuscular re-education;Spinal Manipulations;Passive range of motion;Joint Manipulations;Dry needling;Taping;Manual techniques;Patient/family education    PT Next Visit Plan Cervical, scapular and lumbar strength progressions to help late day postural strain.  Postural/body mechanics education.    PT Home Exercise Plan [email or web address]    Consulted and Agree with Plan of Care Patient             Patient will benefit from skilled therapeutic intervention in order to improve the following deficits and impairments:  Decreased endurance,  Hypomobility, Decreased activity tolerance, Pain, Increased fascial restricitons, Decreased mobility, Impaired perceived functional ability, Improper body mechanics, Postural dysfunction, Impaired flexibility, Decreased range of motion  Visit Diagnosis: Cervicalgia  Abnormal posture     Problem List Patient Active Problem List   Diagnosis Date Noted   Strain of right trapezius muscle 03/15/2018   Abdominal pain 01/11/2018   Fibrocystic breast changes of both breasts 12/09/2014   Toenail deformity 10/30/2014   DUB (dysfunctional uterine bleeding) 03/05/2014   Decreased libido 03/05/2014   Benign positional vertigo 12/21/2010   PALPITATIONS 08/03/2008   OVERWEIGHT 01/06/2008   DEPRESSION 01/06/2008   Back pain 01/06/2008   Headache 01/06/2008    RFarley Ly PT, MPT 05/25/2021, 4:51 PM  PHYSICAL THERAPY DISCHARGE SUMMARY  Visits from Start of Care: 6  Current functional level related to goals / functional outcomes: See note   Remaining deficits: See note   Education / Equipment: HEP   Patient agrees to discharge. Patient goals were partially met. Patient is being discharged due to not returning since the last visit.  MScot Jun PT, DPT, OCS, ATC 07/05/21  1:30 PM     [facility] [address] [address]Phone: [phone]  Fax:  [phone] Name: Dana DRAGOVICHMRN: [id number] of Birth: 03/29/1976/12/22

## 2021-05-25 NOTE — Patient Instructions (Signed)
Access Code: [email or web address] URL: [email or web address] Date: 05/25/2021 Prepared by: Pauletta Browns  Exercises  Standing Scapular Retraction - 5 x daily - 7 x weekly - 1 sets - 5 reps - 5 second hold Standing Isometric Cervical Extension with Manual Resistance - 5 x daily - 7 x weekly - 1 sets - 5 reps - 5 hold Standing Isometric Cervical Sidebending with Manual Resistance - 1-2 x daily - 7 x weekly - 1 sets - 10 reps - 5 hold Prone Alternating Arm and Leg Lifts - 1-2 x daily - 7 x weekly - 2 sets - 10 reps - 3-10 seconds hold Prone Shoulder Horizontal Abduction with Thumbs Up - 1-2 x daily - 7 x weekly - 2 sets - 10 reps - 3 seconds hold

## 2021-06-01 ENCOUNTER — Encounter: Payer: BC Managed Care – PPO | Admitting: Physical Therapy

## 2021-06-15 ENCOUNTER — Telehealth: Payer: Self-pay | Admitting: Physical Therapy

## 2021-06-15 ENCOUNTER — Encounter: Payer: BC Managed Care – PPO | Admitting: Physical Therapy

## 2021-06-15 NOTE — Telephone Encounter (Signed)
Pt no show for PT appointment today. They were contacted by phone with assistance from interpreter. She states she thought her appointment was for tomorrow. This was the last visit she had scheduled and after talking with her she is overall doing much better and feels she does need to return to PT. We informed her that we will keep her episode open 30 days in the event she needs to return and if she does not we will then discharge.  Ivery Quale, PT, DPT 06/15/21 4:24 PM

## 2021-06-16 ENCOUNTER — Ambulatory Visit: Payer: BC Managed Care – PPO | Admitting: Obstetrics & Gynecology

## 2021-07-25 ENCOUNTER — Ambulatory Visit (INDEPENDENT_AMBULATORY_CARE_PROVIDER_SITE_OTHER): Payer: BC Managed Care – PPO | Admitting: Obstetrics & Gynecology

## 2021-07-25 ENCOUNTER — Other Ambulatory Visit: Payer: Self-pay

## 2021-07-25 ENCOUNTER — Encounter: Payer: Self-pay | Admitting: Obstetrics & Gynecology

## 2021-07-25 ENCOUNTER — Other Ambulatory Visit (HOSPITAL_COMMUNITY)
Admission: RE | Admit: 2021-07-25 | Discharge: 2021-07-25 | Disposition: A | Discharge status: Home / Self Care | Payer: BC Managed Care – PPO | Source: Ambulatory Visit | Attending: Obstetrics & Gynecology | Admitting: Obstetrics & Gynecology

## 2021-07-25 VITALS — BP 117/78 | HR 72 | Resp 20 | Ht 63.19 in | Wt 186.8 lb

## 2021-07-25 DIAGNOSIS — Z6832 Body mass index (BMI) 32.0-32.9, adult: Secondary | ICD-10-CM | POA: Diagnosis not present

## 2021-07-25 DIAGNOSIS — Z01419 Encounter for gynecological examination (general) (routine) without abnormal findings: Secondary | ICD-10-CM | POA: Diagnosis not present

## 2021-07-25 DIAGNOSIS — Z9851 Tubal ligation status: Secondary | ICD-10-CM | POA: Diagnosis not present

## 2021-07-25 DIAGNOSIS — E6609 Other obesity due to excess calories: Secondary | ICD-10-CM | POA: Diagnosis not present

## 2021-07-25 NOTE — Progress Notes (Signed)
Dana Pope [birth year] [id number]   History:    47 y.o. G4P4L4 Divorced.  Has a boyfriend.  S/P TL.   RP:  Established patient presenting for annual gyn exam    HPI: Menstrual periods every month with normal flow.  No breakthrough bleeding.  No pelvic pain.  No pain with intercourse.  Pap Neg 09/2018.  Pap reflex today.  Mild depressive symptoms improved recently.  No suicidal ideations. Urine and bowel movements normal.  Breasts normal. Mammo Neg 10/2020. Body mass index improved at 32.89. Health labs with family physician.   Past medical history,surgical history, family history and social history were all reviewed and documented in the EPIC chart.  Gynecologic History Patient's last menstrual period was 07/12/2021 (exact date).  Obstetric History OB History  Gravida Para Term Preterm AB Living  4 4     0 4  SAB IAB Ectopic Multiple Live Births  0   0        # Outcome Date GA Lbr Len/2nd Weight Sex Delivery Anes PTL Lv  4 Para           3 Para           2 Para           1 Para              ROS: A ROS was performed and pertinent positives and negatives are included in the history.  GENERAL: No fevers or chills. HEENT: No change in vision, no earache, sore throat or sinus congestion. NECK: No pain or stiffness. CARDIOVASCULAR: No chest pain or pressure. No palpitations. PULMONARY: No shortness of breath, cough or wheeze. GASTROINTESTINAL: No abdominal pain, nausea, vomiting or diarrhea, melena or bright red blood per rectum. GENITOURINARY: No urinary frequency, urgency, hesitancy or dysuria. MUSCULOSKELETAL: No joint or muscle pain, no back pain, no recent trauma. DERMATOLOGIC: No rash, no itching, no lesions. ENDOCRINE: No polyuria, polydipsia, no heat or cold intolerance. No recent change in weight. HEMATOLOGICAL: No anemia or easy bruising or bleeding. NEUROLOGIC: No headache, seizures, numbness, tingling or weakness. PSYCHIATRIC: No depression, no loss of interest in  normal activity or change in sleep pattern.     Exam:   BP 117/78    Pulse 72    Resp 20    Ht 5' 3.19" (1.605 m)    Wt 186 lb 12.8 oz (84.7 kg)    LMP 07/12/2021 (Exact Date)    SpO2 99%    BMI 32.89 kg/m   Body mass index is 32.89 kg/m.  General appearance : Well developed well nourished female. No acute distress HEENT: Eyes: no retinal hemorrhage or exudates,  Neck supple, trachea midline, no carotid bruits, no thyroidmegaly Lungs: Clear to auscultation, no rhonchi or wheezes, or rib retractions  Heart: Regular rate and rhythm, no murmurs or gallops Breast:Examined in sitting and supine position were symmetrical in appearance, no palpable masses or tenderness,  no skin retraction, no nipple inversion, no nipple discharge, no skin discoloration, no axillary or supraclavicular lymphadenopathy Abdomen: no palpable masses or tenderness, no rebound or guarding Extremities: no edema or skin discoloration or tenderness  Pelvic: Vulva: Normal             Vagina: No gross lesions or discharge  Cervix: No gross lesions or discharge.  Pap reflex done.  Uterus  AV, normal size, shape and consistency, non-tender and mobile  Adnexa  Without masses or tenderness  Anus: Normal   Assessment/Plan:  45  y.o. female for annual exam   1. Encounter for routine gynecological examination with Papanicolaou smear of cervix Menstrual periods every month with normal flow.  No breakthrough bleeding.  No pelvic pain.  No pain with intercourse.  Pap Neg 09/2018.  Pap reflex today.  Mild depressive symptoms improved recently.  No suicidal ideations. Urine and bowel movements normal.  Breasts normal. Mammo Neg 10/2020. Body mass index improved at 32.89. Health labs with family physician. - Pap reflex  2. S/P tubal ligation  3. Class 1 obesity due to excess calories without serious comorbidity with body mass index (BMI) of 32.0 to 32.9 in adult  Improved BMI x last year.  Continue on a low carb/calorie diet.   Increase fitness activities.  Genia Del MD, 2:21 PM 07/25/2021

## 2021-07-27 LAB — CYTOLOGY - PAP
Adequacy: ABSENT
Diagnosis: NEGATIVE

## 2021-08-11 ENCOUNTER — Ambulatory Visit (HOSPITAL_COMMUNITY)
Admission: EM | Admit: 2021-08-11 | Discharge: 2021-08-11 | Disposition: A | Discharge status: Home / Self Care | Payer: BC Managed Care – PPO | Attending: Internal Medicine | Admitting: Internal Medicine

## 2021-08-11 ENCOUNTER — Encounter (HOSPITAL_COMMUNITY): Payer: Self-pay

## 2021-08-11 ENCOUNTER — Other Ambulatory Visit: Payer: Self-pay

## 2021-08-11 DIAGNOSIS — G43109 Migraine with aura, not intractable, without status migrainosus: Secondary | ICD-10-CM

## 2021-08-11 MED ORDER — SUMATRIPTAN SUCCINATE 6 MG/0.5ML ~~LOC~~ SOLN
6.0000 mg | Freq: Once | SUBCUTANEOUS | Status: AC
  Administered 2021-08-11: 6 mg via SUBCUTANEOUS

## 2021-08-11 MED ORDER — SUMATRIPTAN SUCCINATE 6 MG/0.5ML ~~LOC~~ SOLN
SUBCUTANEOUS | Status: AC
  Filled 2021-08-11: qty 0.5

## 2021-08-11 MED ORDER — KETOROLAC TROMETHAMINE 30 MG/ML IJ SOLN
30.0000 mg | Freq: Once | INTRAMUSCULAR | Status: AC
  Administered 2021-08-11: 30 mg via INTRAMUSCULAR

## 2021-08-11 MED ORDER — SUMATRIPTAN SUCCINATE 25 MG PO TABS: 25.0000 mg | ORAL_TABLET | Freq: Once | ORAL | 0 refills | Status: DC

## 2021-08-11 MED ORDER — KETOROLAC TROMETHAMINE 30 MG/ML IJ SOLN
INTRAMUSCULAR | Status: AC
  Filled 2021-08-11: qty 1

## 2021-08-11 NOTE — ED Triage Notes (Signed)
Pt presents to the office for headache, dizziness and sinus pressure for several days.

## 2021-08-11 NOTE — Discharge Instructions (Addendum)
Please take medications as prescribed You can take melatonin at bedtime to help you avoid sleeping Return to urgent care if symptoms worsens Maintain adequate hydration.

## 2021-08-12 NOTE — ED Provider Notes (Signed)
[facility]    CSN: [id number] Arrival date & time: 08/11/21  1909      History   Chief Complaint Chief Complaint  Patient presents with   Headache    Sinus pressure and dizziness started several days ago.     HPI Dana Pope is a 46 y.o. female comes to the urgent care with 2-week history of intermittent left-sided headache.  Patient's husband was recently admitted to the hospital for couple of weeks.  During that time the patient endorses poor sleep and being under stress.  She started experiencing the headaches which has worsened over the past few days.  Headache is mainly left-sided on the occipital side.  It is associated with noncolored floaters in the visual field.  Headache is aggravated by light.  She has tried Tylenol, ibuprofen and naproxen with no improvement in symptoms.  She had nausea with no vomiting.  No dizziness. HPI  Past Medical History:  Diagnosis Date   Back pain    Migraine    Vision abnormalities     Patient Active Problem List   Diagnosis Date Noted   Strain of right trapezius muscle 03/15/2018   Abdominal pain 01/11/2018   Fibrocystic breast changes of both breasts 12/09/2014   Toenail deformity 10/30/2014   DUB (dysfunctional uterine bleeding) 03/05/2014   Decreased libido 03/05/2014   Benign positional vertigo 12/21/2010   PALPITATIONS 08/03/2008   OVERWEIGHT 01/06/2008   DEPRESSION 01/06/2008   Back pain 01/06/2008   Headache 01/06/2008    Past Surgical History:  Procedure Laterality Date   CESAREAN SECTION     X4   TUBAL LIGATION      OB History     Gravida  4   Para  4   Term      Preterm      AB  0   Living  4      SAB  0   IAB      Ectopic  0   Multiple      Live Births               Home Medications    Prior to Admission medications   Medication Sig Start Date End Date Taking? Authorizing Provider  SUMAtriptan (IMITREX) 25 MG tablet Take 1 tablet (25 mg total) by mouth once for  1 dose. May repeat in 2 hours if headache persists or recurs. 08/11/21 08/11/21 Yes Shalin Vonbargen, Britta Mccreedy, MD  acetaminophen (TYLENOL) 500 MG tablet Take 1,000 mg by mouth every 6 (six) hours as needed.    [provider]  albuterol (VENTOLIN HFA) 108 (90 Base) MCG/ACT inhaler SMARTSIG:1-2 Puff(s) Via Inhaler Every 4-6 Hours PRN 07/14/19   [provider]    Family History Family History  Problem Relation Age of Onset   Diabetes Mother    Hypertension Mother    Cancer Maternal Grandmother        UTERINE   Cancer Paternal Grandmother        SKIN   Hypertension Father    Healthy Sister    Healthy Brother    Healthy Sister    Healthy Sister    Healthy Sister    Healthy Sister    Healthy Sister    Healthy Brother    Healthy Brother    Healthy Brother     Social History Social History   Tobacco Use   Smoking status: Never   Smokeless tobacco: Never  Vaping Use   Vaping Use:  Never used  Substance Use Topics   Alcohol use: Not Currently    Alcohol/week: 0.0 standard drinks   Drug use: No     Allergies   Amoxicillin   Review of Systems Review of Systems  Constitutional: Negative.   HENT: Negative.    Cardiovascular: Negative.   Gastrointestinal:  Positive for nausea. Negative for abdominal pain and vomiting.  Musculoskeletal: Negative.   Neurological:  Positive for headaches.    Physical Exam Triage Vital Signs ED Triage Vitals  Enc Vitals Group     BP 08/11/21 2028 130/82     Pulse Rate 08/11/21 2028 63     Resp 08/11/21 2028 16     Temp 08/11/21 2028 98.1 F (36.7 C)     Temp Source 08/11/21 2028 Oral     SpO2 08/11/21 2028 100 %     Weight --      Height --      Head Circumference --      Peak Flow --      Pain Score 08/11/21 2030 5     Pain Loc --      Pain Edu? --      Excl. in GC? --    No data found.  Updated Vital Signs BP 130/82 (BP Location: Left Arm)    Pulse 63    Temp 98.1 F (36.7 C) (Oral)    Resp 16    LMP 08/11/2021  (Exact Date)    SpO2 100%   Visual Acuity Right Eye Distance:   Left Eye Distance:   Bilateral Distance:    Right Eye Near:   Left Eye Near:    Bilateral Near:     Physical Exam Vitals and nursing note reviewed.  Eyes:     General: No visual field deficit. Cardiovascular:     Rate and Rhythm: Normal rate and regular rhythm.  Pulmonary:     Effort: Pulmonary effort is normal.     Breath sounds: Normal breath sounds.  Abdominal:     General: Bowel sounds are normal.     Palpations: Abdomen is soft.  Neurological:     GCS: GCS eye subscore is 4. GCS verbal subscore is 5. GCS motor subscore is 6.     Cranial Nerves: No cranial nerve deficit, dysarthria or facial asymmetry.  Psychiatric:        Mood and Affect: Mood normal.     UC Treatments / Results  Labs (all labs ordered are listed, but only abnormal results are displayed) Labs Reviewed - No data to display  EKG   Radiology No results found.  Procedures Procedures (including critical care time)  Medications Ordered in UC Medications  ketorolac (TORADOL) 30 MG/ML injection 30 mg (30 mg Intramuscular Given 08/11/21 2051)  SUMAtriptan (IMITREX) injection 6 mg (6 mg Subcutaneous Given 08/11/21 2054)    Initial Impression / Assessment and Plan / UC Course  I have reviewed the triage vital signs and the nursing notes.  Pertinent labs & imaging results that were available during my care of the patient were reviewed by me and considered in my medical decision making (see chart for details).     1.  Migraine with aura: Toradol 30 mg IM x1 dose Imitrex 6 mg subcu Imitrex 25 mg to be taken at the outset of the headache If symptoms persist or worsens please return to urgent care to be reevaluated. Neuro exam is normal. Final Clinical Impressions(s) / UC Diagnoses   Final diagnoses:  Migraine  with aura and without status migrainosus, not intractable     Discharge Instructions      Please take medications as  prescribed You can take melatonin at bedtime to help you avoid sleeping Return to urgent care if symptoms worsens Maintain adequate hydration.   ED Prescriptions     Medication Sig Dispense Auth. Provider   SUMAtriptan (IMITREX) 25 MG tablet Take 1 tablet (25 mg total) by mouth once for 1 dose. May repeat in 2 hours if headache persists or recurs. 15 tablet Zeba Luby, Britta Mccreedy, MD      PDMP not reviewed this encounter.   Merrilee Jansky, MD 08/12/21 1247

## 2021-11-16 DIAGNOSIS — M25775 Osteophyte, left foot: Secondary | ICD-10-CM | POA: Diagnosis not present

## 2021-11-16 DIAGNOSIS — M79671 Pain in right foot: Secondary | ICD-10-CM | POA: Diagnosis not present

## 2021-11-16 DIAGNOSIS — B351 Tinea unguium: Secondary | ICD-10-CM | POA: Diagnosis not present

## 2021-11-16 DIAGNOSIS — M79672 Pain in left foot: Secondary | ICD-10-CM | POA: Diagnosis not present

## 2021-11-16 DIAGNOSIS — L600 Ingrowing nail: Secondary | ICD-10-CM | POA: Diagnosis not present

## 2022-01-15 ENCOUNTER — Encounter (HOSPITAL_COMMUNITY): Payer: Self-pay | Admitting: Emergency Medicine

## 2022-01-15 ENCOUNTER — Other Ambulatory Visit: Payer: Self-pay

## 2022-01-15 ENCOUNTER — Ambulatory Visit (HOSPITAL_COMMUNITY)
Admission: EM | Admit: 2022-01-15 | Discharge: 2022-01-15 | Disposition: A | Discharge status: Home / Self Care | Payer: BC Managed Care – PPO | Attending: Family Medicine | Admitting: Family Medicine

## 2022-01-15 DIAGNOSIS — R42 Dizziness and giddiness: Secondary | ICD-10-CM

## 2022-01-15 MED ORDER — MECLIZINE HCL 25 MG PO TABS: 25.0000 mg | ORAL_TABLET | Freq: Three times a day (TID) | ORAL | 0 refills | Status: DC | PRN

## 2022-01-15 NOTE — ED Triage Notes (Addendum)
Complains of dizziness and nausea, no vomiting.  When moving feels like room is moving.    Patient drank alcohol last night and thinks this is related  Last week was having leg pain.  Patient went to clinic and had labs done.  Patient had a few abnormal labs

## 2022-01-15 NOTE — ED Provider Notes (Signed)
[facility]    CSN: [id number] Arrival date & time: 01/15/22  1506      History   Chief Complaint Chief Complaint  Patient presents with   Dizziness    HPI Dana Pope is a 46 y.o. female.    Dizziness  Here for vertigo that began about 3 days ago.  She feels the room spinning.  She drank 6 or 7 beers yesterday evening and her vertigo was worse today.  She does feel nausea no vomiting.  No fever.  Her period was this past week.  She did just have lab work done with her work clinic, and was told her potassium and calcium are high.  She was told to stop taking her terbinafine, that was prescribed for nail fungus.  She notes pain from a tooth on her left side.  She wants to know if that has caused her vertigo.  She has seen a dentist etc. about that  She has twinges of pain in her mid back.  She wants to make sure that that is not a sign of harm to her kidneys or liver.  On questioning, she states that now they said everything was fine on the recent lab work  Past Medical History:  Diagnosis Date   Back pain    Migraine    Vision abnormalities     Patient Active Problem List   Diagnosis Date Noted   Strain of right trapezius muscle 03/15/2018   Abdominal pain 01/11/2018   Fibrocystic breast changes of both breasts 12/09/2014   Toenail deformity 10/30/2014   DUB (dysfunctional uterine bleeding) 03/05/2014   Decreased libido 03/05/2014   Benign positional vertigo 12/21/2010   PALPITATIONS 08/03/2008   OVERWEIGHT 01/06/2008   DEPRESSION 01/06/2008   Back pain 01/06/2008   Headache 01/06/2008    Past Surgical History:  Procedure Laterality Date   CESAREAN SECTION     X4   TUBAL LIGATION      OB History     Gravida  4   Para  4   Term      Preterm      AB  0   Living  4      SAB  0   IAB      Ectopic  0   Multiple      Live Births               Home Medications    Prior to Admission medications   Medication  Sig Start Date End Date Taking? Authorizing Provider  meclizine (ANTIVERT) 25 MG tablet Take 1 tablet (25 mg total) by mouth 3 (three) times daily as needed for dizziness. 01/15/22  Yes Zenia Resides, MD  acetaminophen (TYLENOL) 500 MG tablet Take 1,000 mg by mouth every 6 (six) hours as needed.    [provider]  albuterol (VENTOLIN HFA) 108 (90 Base) MCG/ACT inhaler SMARTSIG:1-2 Puff(s) Via Inhaler Every 4-6 Hours PRN 07/14/19   [provider]  SUMAtriptan (IMITREX) 25 MG tablet Take 1 tablet (25 mg total) by mouth once for 1 dose. May repeat in 2 hours if headache persists or recurs. 08/11/21 08/11/21  LampteyBritta Mccreedy, MD    Family History Family History  Problem Relation Age of Onset   Diabetes Mother    Hypertension Mother    Cancer Maternal Grandmother        UTERINE   Cancer Paternal Grandmother        SKIN   Hypertension  Father    Healthy Sister    Healthy Brother    Healthy Sister    Healthy Sister    Healthy Sister    Healthy Sister    Healthy Sister    Healthy Brother    Healthy Brother    Healthy Brother     Social History Social History   Tobacco Use   Smoking status: Never   Smokeless tobacco: Never  Vaping Use   Vaping Use: Never used  Substance Use Topics   Alcohol use: Not Currently    Alcohol/week: 0.0 standard drinks of alcohol   Drug use: No     Allergies   Amoxicillin   Review of Systems Review of Systems  Neurological:  Positive for dizziness.     Physical Exam Triage Vital Signs ED Triage Vitals  Enc Vitals Group     BP 01/15/22 1536 120/77     Pulse Rate 01/15/22 1536 76     Resp 01/15/22 1536 18     Temp 01/15/22 1536 (!) 97.5 F (36.4 C)     Temp Source 01/15/22 1536 Oral     SpO2 01/15/22 1536 98 %     Weight --      Height --      Head Circumference --      Peak Flow --      Pain Score 01/15/22 1534 0     Pain Loc --      Pain Edu? --      Excl. in GC? --    No data found.  Updated Vital  Signs BP 120/77 (BP Location: Left Arm)   Pulse 76   Temp (!) 97.5 F (36.4 C) (Oral)   Resp 18   LMP 01/04/2022   SpO2 98%   Visual Acuity Right Eye Distance:   Left Eye Distance:   Bilateral Distance:    Right Eye Near:   Left Eye Near:    Bilateral Near:     Physical Exam Vitals reviewed.  Constitutional:      General: She is not in acute distress.    Appearance: She is not toxic-appearing.  HENT:     Right Ear: Tympanic membrane normal.     Left Ear: Tympanic membrane normal.     Mouth/Throat:     Mouth: Mucous membranes are moist.  Eyes:     Extraocular Movements: Extraocular movements intact.     Pupils: Pupils are equal, round, and reactive to light.  Cardiovascular:     Rate and Rhythm: Normal rate.     Heart sounds: No murmur heard. Pulmonary:     Effort: Pulmonary effort is normal.     Breath sounds: Normal breath sounds.  Musculoskeletal:     Cervical back: Neck supple.  Lymphadenopathy:     Cervical: No cervical adenopathy.  Skin:    Coloration: Skin is not jaundiced or pale.  Neurological:     Mental Status: She is alert and oriented to person, place, and time.  Psychiatric:        Behavior: Behavior normal.      UC Treatments / Results  Labs (all labs ordered are listed, but only abnormal results are displayed) Labs Reviewed - No data to display  EKG   Radiology No results found.  Procedures Procedures (including critical care time)  Medications Ordered in UC Medications - No data to display  Initial Impression / Assessment and Plan / UC Course  I have reviewed the triage vital signs and the  nursing notes.  Pertinent labs & imaging results that were available during my care of the patient were reviewed by me and considered in my medical decision making (see chart for details).     I have tried to answer all her questions and provided reassurance.  We will treat with meclizine for the vertigo.  She will have labs repeated in  another week or 2. Final Clinical Impressions(s) / UC Diagnoses   Final diagnoses:  Vertigo     Discharge Instructions      Meclizine 25 mg--1 tablet 3 times daily as needed for vertigo     ED Prescriptions     Medication Sig Dispense Auth. Provider   meclizine (ANTIVERT) 25 MG tablet Take 1 tablet (25 mg total) by mouth 3 (three) times daily as needed for dizziness. 30 tablet Gerlad Pelzel, Janace Aris, MD      PDMP not reviewed this encounter.   Zenia Resides, MD 01/15/22 [phone]

## 2022-01-15 NOTE — Discharge Instructions (Addendum)
Meclizine 25 mg--1 tablet 3 times daily as needed for vertigo

## 2022-06-05 ENCOUNTER — Ambulatory Visit (HOSPITAL_COMMUNITY)
Admission: EM | Admit: 2022-06-05 | Discharge: 2022-06-05 | Disposition: A | Discharge status: Home / Self Care | Payer: BC Managed Care – PPO

## 2022-06-05 ENCOUNTER — Encounter (HOSPITAL_COMMUNITY): Payer: Self-pay | Admitting: Emergency Medicine

## 2022-06-05 DIAGNOSIS — H00014 Hordeolum externum left upper eyelid: Secondary | ICD-10-CM

## 2022-06-05 NOTE — ED Triage Notes (Signed)
Left eye pain, irritation x 4 days with watery drainage and crusting on waking, describes it as having been "glued shut" this morning. Does not recall getting anything in the eye. Denies changes to vision/blurred vision, headache, runny nose

## 2022-06-05 NOTE — Discharge Instructions (Addendum)
Warm compresses as needed Follow up with PCP if symptoms do not improve  I hope you feel better soon!

## 2022-06-05 NOTE — ED Provider Notes (Signed)
[facility]    CSN: [id number] Arrival date & time: 06/05/22  0854      History   Chief Complaint Chief Complaint  Patient presents with   Eye Pain    HPI Dana Pope is a 46 y.o. female presents for evaluation of eye lid redness and swelling.  Patient reports 4 days of left upper eyelid redness with mild swelling.  She reports "crusty's" in the morning around the eyelash line.  Denies any eye pain, drainage, visual changes, injury today.  She wears glasses only.  No URI symptoms.  No allergy symptoms.  She has not used any OTC medications since onset.  No other concern at this time.   Eye Pain    History reviewed. No pertinent past medical history.  There are no problems to display for this patient.   History reviewed. No pertinent surgical history.  OB History   No obstetric history on file.      Home Medications    Prior to Admission medications   Not on File    Family History History reviewed. No pertinent family history.  Social History Social History   Tobacco Use   Smoking status: Unknown     Allergies   Amoxicillin   Review of Systems Review of Systems  Eyes:        Left upper eyelid pain     Physical Exam Triage Vital Signs ED Triage Vitals  Enc Vitals Group     BP 06/05/22 1042 120/72     Pulse Rate 06/05/22 1042 69     Resp 06/05/22 1042 16     Temp 06/05/22 1042 98 F (36.7 C)     Temp Source 06/05/22 1042 Oral     SpO2 06/05/22 1042 96 %     Weight --      Height --      Head Circumference --      Peak Flow --      Pain Score 06/05/22 1043 6     Pain Loc --      Pain Edu? --      Excl. in GC? --    No data found.  Updated Vital Signs BP 120/72 (BP Location: Right Arm)   Pulse 69   Temp 98 F (36.7 C) (Oral)   Resp 16   SpO2 96%   Visual Acuity Right Eye Distance: 20/20 Left Eye Distance: 20/100 Bilateral Distance: 20/20  Right Eye Near:   Left Eye Near:    Bilateral Near:      Physical Exam Vitals and nursing note reviewed.  Constitutional:      General: She is not in acute distress.    Appearance: Normal appearance. She is not ill-appearing.  HENT:     Head: Normocephalic and atraumatic.  Eyes:     General:        Left eye: Hordeolum present.No foreign body or discharge.     Extraocular Movements: Extraocular movements intact.     Conjunctiva/sclera: Conjunctivae normal.     Pupils: Pupils are equal, round, and reactive to light.      Comments: Mild upper eyelid swelling without erythema or warmth.  No periorbital swelling  Cardiovascular:     Rate and Rhythm: Normal rate.  Pulmonary:     Effort: Pulmonary effort is normal.  Skin:    General: Skin is warm and dry.  Neurological:     General: No focal deficit present.     Mental Status: She is  alert and oriented to person, place, and time.  Psychiatric:        Mood and Affect: Mood normal.        Behavior: Behavior normal.    Visual acuity L 20/20, right 20/20, bilateral 20/20 with correction   UC Treatments / Results  Labs (all labs ordered are listed, but only abnormal results are displayed) Labs Reviewed - No data to display  EKG   Radiology No results found.  Procedures Procedures (including critical care time)  Medications Ordered in UC Medications - No data to display  Initial Impression / Assessment and Plan / UC Course  I have reviewed the triage vital signs and the nursing notes.  Pertinent labs & imaging results that were available during my care of the patient were reviewed by me and considered in my medical decision making (see chart for details).     Discussed with patient stye and symptomatic treatment Warm compresses as needed Follow-up as symptoms do not improve  ER precautions reviewed and patient verbalized understanding Final Clinical Impressions(s) / UC Diagnoses   Final diagnoses:  Hordeolum externum of left upper eyelid     Discharge Instructions       Warm compresses as needed Follow up with PCP if symptoms do not improve  I hope you feel better soon!    ED Prescriptions   None    PDMP not reviewed this encounter.   Radford Pax, NP 06/05/22 [phone]

## 2022-06-06 ENCOUNTER — Encounter (HOSPITAL_COMMUNITY): Payer: Self-pay | Admitting: Emergency Medicine

## 2022-08-02 ENCOUNTER — Ambulatory Visit: Payer: BC Managed Care – PPO | Admitting: Obstetrics & Gynecology

## 2022-08-15 IMAGING — MR MR CERVICAL SPINE W/O CM
5 series · 39 of 48 positions shown · non-contrast
Comparison: 07/10/2018

CLINICAL DATA: Neck pain radiating into the left arm

EXAM:
MRI CERVICAL SPINE WITHOUT CONTRAST
TECHNIQUE: Multiplanar, multisequence MR imaging of the cervical spine was
performed. No intravenous contrast was administered.

[Series 2: T2 · sagittal · 3.0mm · 0.41mm/px · 9 of 19 slices shown (1 of 2)]
[im 1/19]
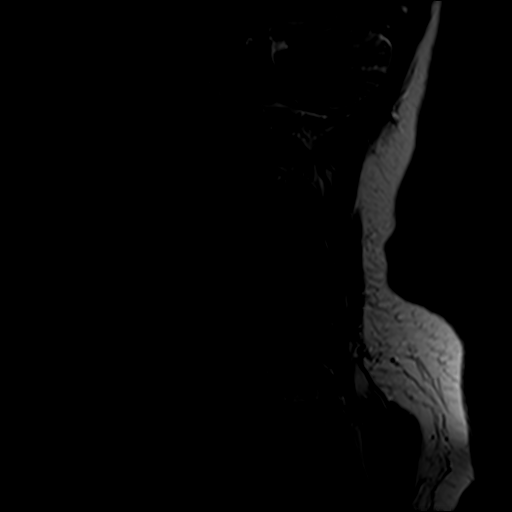
[im 3/19]
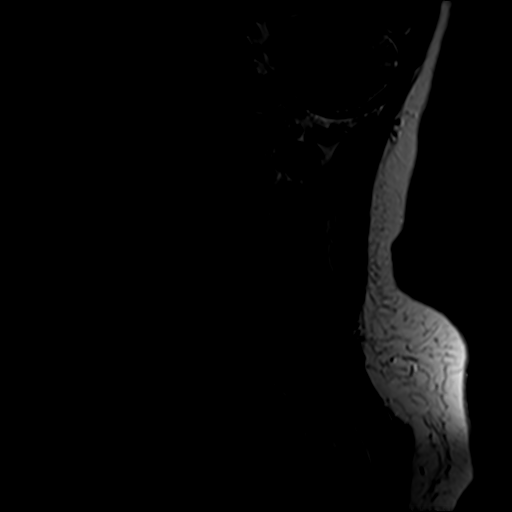
[im 5/19]
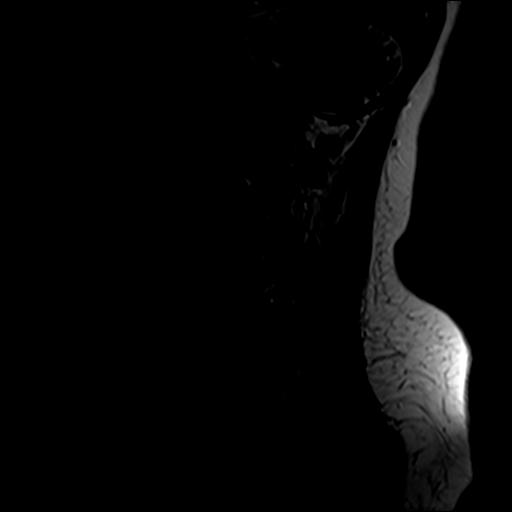
[im 7/19]
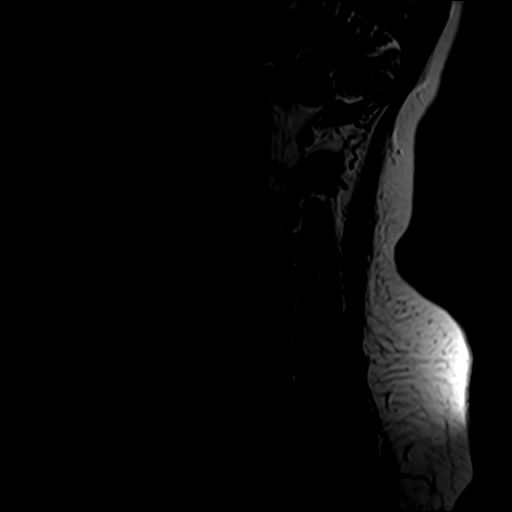
[im 10/19]
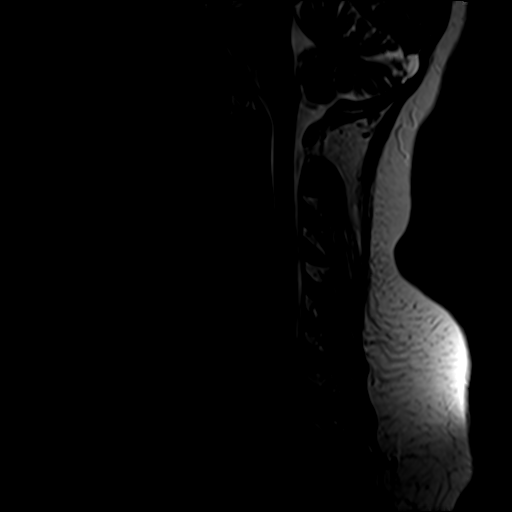
[im 12/19]
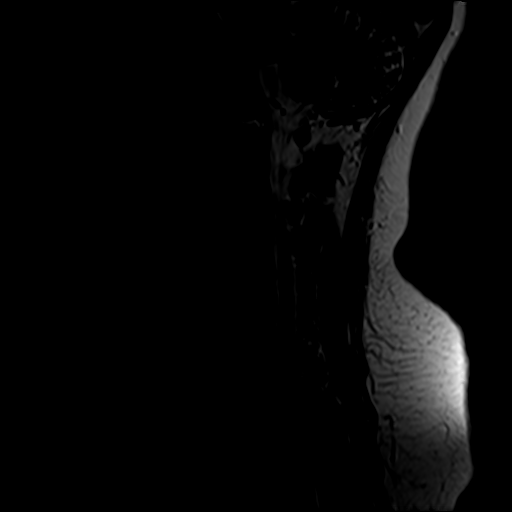
[im 14/19]
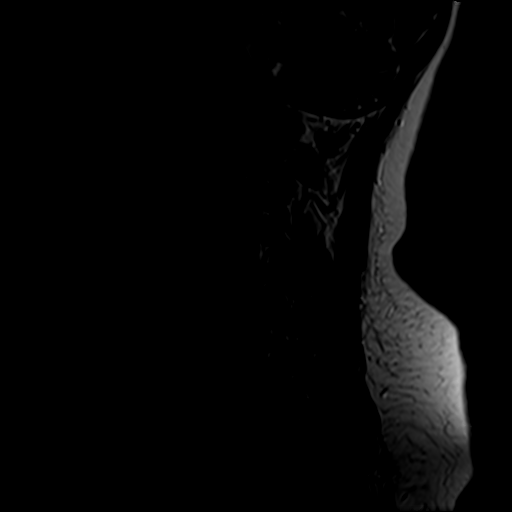
[im 16/19]
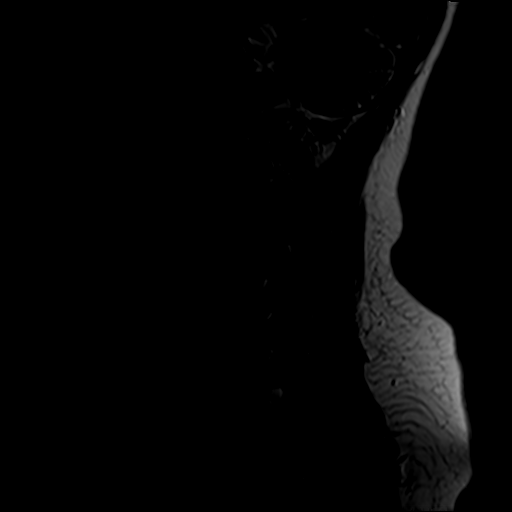
[im 19/19]
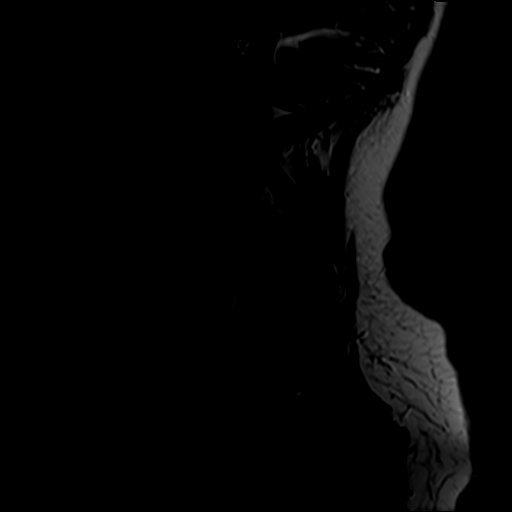

[Series 3: STIR · sagittal · 3.0mm · 0.82mm/px · 9 of 19 slices shown]
[im 1/19]
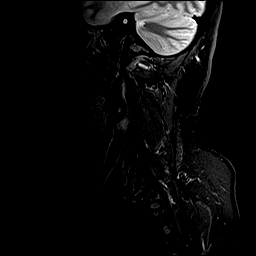
[im 3/19]
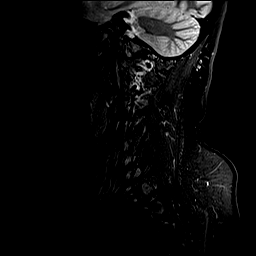
[im 5/19]
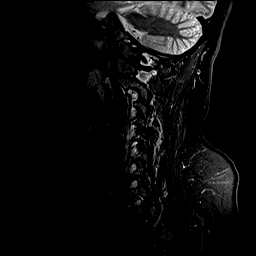
[im 7/19]
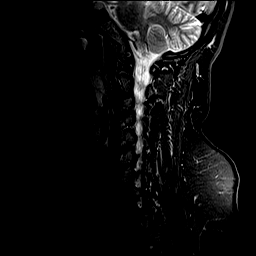
[im 10/19]
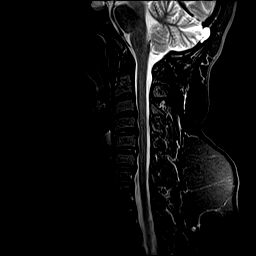
[im 12/19]
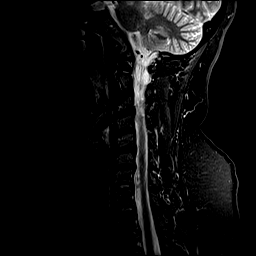
[im 14/19]
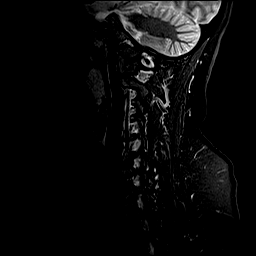
[im 16/19]
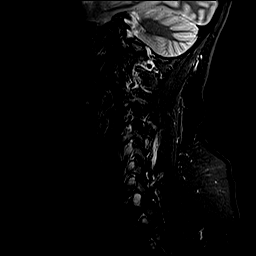
[im 19/19]
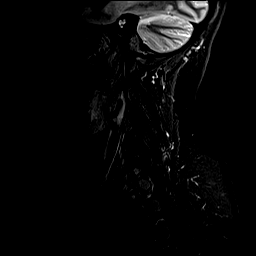

[Series 4: T1 · sagittal · 3.0mm · 0.82mm/px · 8 of 19 slices shown]
[im 1/19]
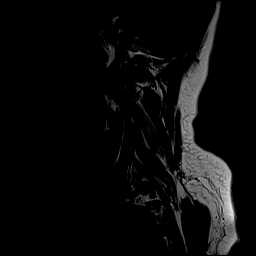
[im 3/19]
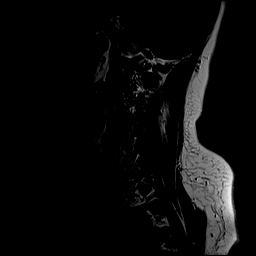
[im 6/19]
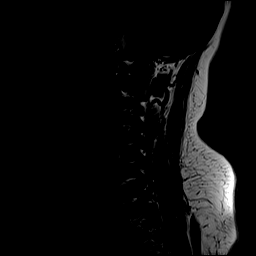
[im 8/19]
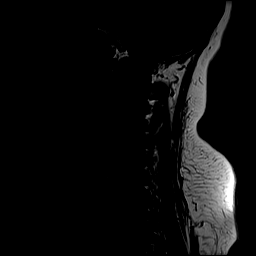
[im 11/19]
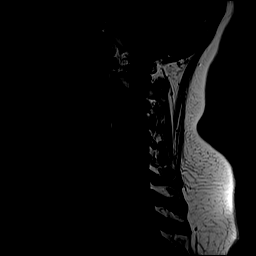
[im 13/19]
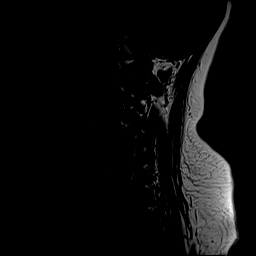
[im 16/19]
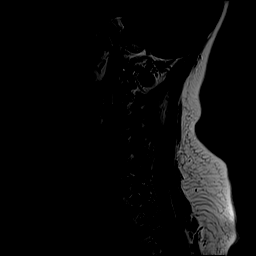
[im 19/19]
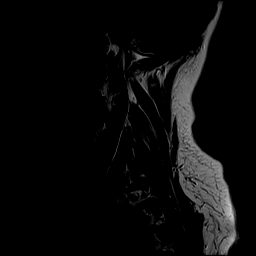

[Series 5: T2 · axial · 3.0mm · 0.70mm/px · z∈[-95,-4]mm · 11 of 26 slices shown (2 of 2)]
[im 1/26]
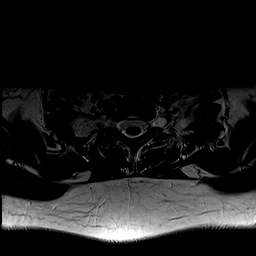
[im 3/26]
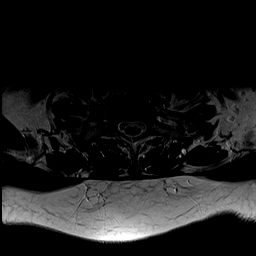
[im 6/26]
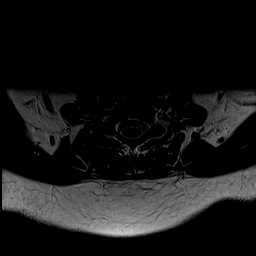
[im 8/26]
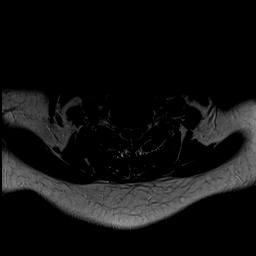
[im 11/26]
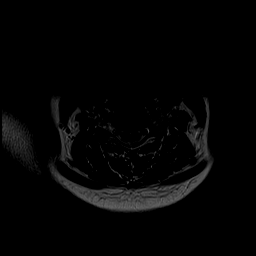
[im 13/26]
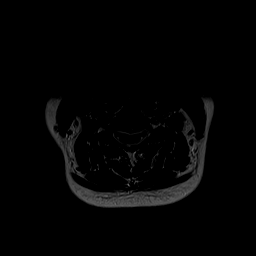
[im 16/26]
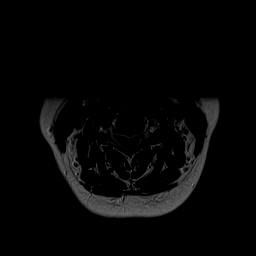
[im 18/26]
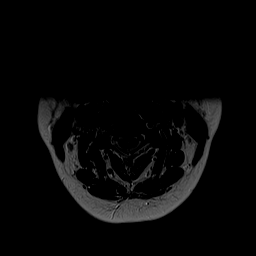
[im 21/26]
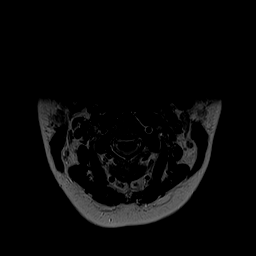
[im 23/26]
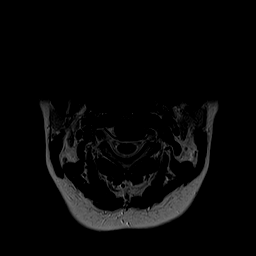
[im 26/26]
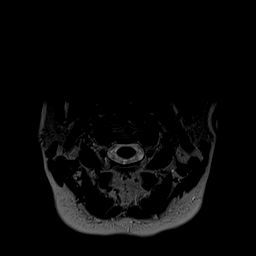

[Series 7: GRE · axial · 3.0mm · 0.35mm/px · z∈[-95,-77]mm · 2 of 26 slices shown]
[im 1/26]
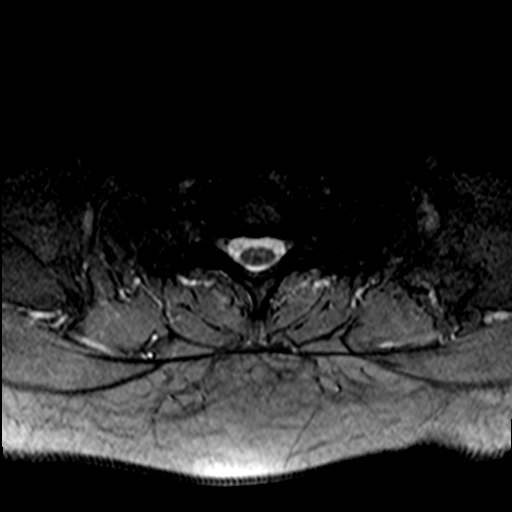
[im 6/26]
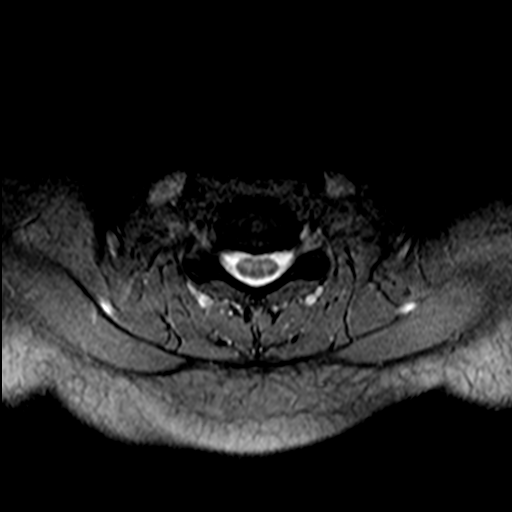

[39 of 48 positions shown; findings below may reference images not displayed]

FINDINGS: Alignment: Reversal of cervical lordosis

Vertebrae: No fracture, evidence of discitis, or bone lesion.

Cord: Normal signal and morphology.

Posterior Fossa, vertebral arteries, paraspinal tissues: Negative.

Disc levels:

C2-3: Unremarkable.

C3-4: Disc narrowing with shallow central protrusion

C4-5: Disc narrowing with shallow central protrusion contacting but
not compressing the cord

C5-6: Mild disc narrowing with shallow central protrusion contacting
the ventral cord

C6-7: Shallow central protrusion.

C7-T1:Unremarkable.
IMPRESSION: Small central disc protrusions at C3-4 to C6-7. No cord compression
or foraminal stenosis.

## 2022-08-17 ENCOUNTER — Encounter: Payer: Self-pay | Admitting: Obstetrics & Gynecology

## 2022-08-17 ENCOUNTER — Ambulatory Visit: Payer: Medicaid Other | Admitting: Obstetrics & Gynecology

## 2022-08-17 ENCOUNTER — Ambulatory Visit: Payer: BC Managed Care – PPO | Admitting: Obstetrics & Gynecology

## 2022-08-17 VITALS — BP 120/72 | HR 76 | Ht 63.25 in | Wt 178.0 lb

## 2022-08-17 DIAGNOSIS — Z9851 Tubal ligation status: Secondary | ICD-10-CM | POA: Diagnosis not present

## 2022-08-17 DIAGNOSIS — Z01419 Encounter for gynecological examination (general) (routine) without abnormal findings: Secondary | ICD-10-CM

## 2022-08-17 DIAGNOSIS — F4321 Adjustment disorder with depressed mood: Secondary | ICD-10-CM | POA: Diagnosis not present

## 2022-08-17 NOTE — Progress Notes (Signed)
Dana Pope [id number]   History:    47 y.o.  G4P4L4 Divorced.  Has a boyfriend.  S/P TL.   RP:  Established patient presenting for annual gyn exam    HPI: Menstrual periods every month with normal flow.  No breakthrough bleeding.  No pelvic pain.  No pain with intercourse.  Pap Neg 07/2021.  Repeat Pap at 3 years.  Chronic depressive symptoms, no suicidal ideations.  Declines referral.  Membership at the [place], plans to increase physical activities. Urine and bowel movements normal. Breasts normal. Mammo Neg 10/2020.  Will schedule Mammo now.  [employer].  Body mass index improved at 31.28. Recommend establishing with a fam MD.    Past medical history,surgical history, family history and social history were all reviewed and documented in the EPIC chart.  Gynecologic History Patient's last menstrual period was 08/03/2022 (exact date).  Obstetric History OB History  Gravida Para Term Preterm AB Living  4 4 4 $ 0 0 4  SAB IAB Ectopic Multiple Live Births  0 0 0   4    # Outcome Date GA Lbr Len/2nd Weight Sex Delivery Anes PTL Lv  4 Term           3 Term           2 Term           1 Term              ROS: A ROS was performed and pertinent positives and negatives are included in the history. GENERAL: No fevers or chills. HEENT: No change in vision, no earache, sore throat or sinus congestion. NECK: No pain or stiffness. CARDIOVASCULAR: No chest pain or pressure. No palpitations. PULMONARY: No shortness of breath, cough or wheeze. GASTROINTESTINAL: No abdominal pain, nausea, vomiting or diarrhea, melena or bright red blood per rectum. GENITOURINARY: No urinary frequency, urgency, hesitancy or dysuria. MUSCULOSKELETAL: No joint or muscle pain, no back pain, no recent trauma. DERMATOLOGIC: No rash, no itching, no lesions. ENDOCRINE: No polyuria, polydipsia, no heat or cold intolerance. No recent change in weight. HEMATOLOGICAL: No anemia or easy bruising or bleeding.  NEUROLOGIC: No headache, seizures, numbness, tingling or weakness. PSYCHIATRIC: No depression, no loss of interest in normal activity or change in sleep pattern.     Exam:   BP 120/72   Pulse 76   Ht 5' 3.25" (1.607 m)   Wt 178 lb (80.7 kg)   LMP 08/03/2022 (Exact Date) Comment: btl-not sexually active  SpO2 99%   BMI 31.28 kg/m   Body mass index is 31.28 kg/m.  General appearance : Well developed well nourished female. No acute distress HEENT: Eyes: no retinal hemorrhage or exudates,  Neck supple, trachea midline, no carotid bruits, no thyroidmegaly Lungs: Clear to auscultation, no rhonchi or wheezes, or rib retractions  Heart: Regular rate and rhythm, no murmurs or gallops Breast:Examined in sitting and supine position were symmetrical in appearance, no palpable masses or tenderness,  no skin retraction, no nipple inversion, no nipple discharge, no skin discoloration, no axillary or supraclavicular lymphadenopathy Abdomen: no palpable masses or tenderness, no rebound or guarding Extremities: no edema or skin discoloration or tenderness  Pelvic: Vulva: Normal             Vagina: No gross lesions or discharge  Cervix: No gross lesions or discharge  Uterus  AV, normal size, shape and consistency, non-tender and mobile  Adnexa  Without masses or tenderness  Anus: Normal  Assessment/Plan:  47 y.o. female for annual exam   1. Well female exam with routine gynecological exam Menstrual periods every month with normal flow.  No breakthrough bleeding.  No pelvic pain.  No pain with intercourse.  Pap Neg 07/2021.  Repeat Pap at 3 years.  Chronic depressive symptoms, no suicidal ideations.  Declines referral.  Membership at the [employer], plans to increase physical activities. Urine and bowel movements normal. Breasts normal. Mammo Neg 10/2020.  Will schedule Mammo now.  [employer].  Body mass index improved at 31.28. Recommend establishing with a fam MD.  2. S/P tubal ligation  3.  Adjustment disorder with depressed mood Chronic depressive Sxs.  No suicidal ideation.  Declines referral to Psychiatrist or Psychotherapist.  Plans to increase physical activities, new [employer].  Other orders - VITAMIN E PO; Take by mouth.   [name] MD, 2:24 PM

## 2022-08-23 ENCOUNTER — Other Ambulatory Visit: Payer: Self-pay | Admitting: Obstetrics & Gynecology

## 2022-08-23 DIAGNOSIS — Z1231 Encounter for screening mammogram for malignant neoplasm of breast: Secondary | ICD-10-CM

## 2022-08-28 ENCOUNTER — Ambulatory Visit
Admission: RE | Admit: 2022-08-28 | Discharge: 2022-08-28 | Disposition: A | Discharge status: Home / Self Care | Payer: Medicaid Other | Source: Ambulatory Visit | Attending: Obstetrics & Gynecology | Admitting: Obstetrics & Gynecology

## 2022-08-28 DIAGNOSIS — Z1231 Encounter for screening mammogram for malignant neoplasm of breast: Secondary | ICD-10-CM

## 2022-10-30 ENCOUNTER — Ambulatory Visit (HOSPITAL_COMMUNITY)
Admission: EM | Admit: 2022-10-30 | Discharge: 2022-10-30 | Disposition: A | Discharge status: Home / Self Care | Payer: Medicaid Other | Attending: Emergency Medicine | Admitting: Emergency Medicine

## 2022-10-30 ENCOUNTER — Encounter (HOSPITAL_COMMUNITY): Payer: Self-pay

## 2022-10-30 DIAGNOSIS — J302 Other seasonal allergic rhinitis: Secondary | ICD-10-CM | POA: Diagnosis not present

## 2022-10-30 LAB — POCT RAPID STREP A (OFFICE): Rapid Strep A Screen: NEGATIVE

## 2022-10-30 MED ORDER — CETIRIZINE HCL 10 MG PO TABS: 10.0000 mg | ORAL_TABLET | Freq: Every day | ORAL | 2 refills | Status: DC

## 2022-10-30 MED ORDER — OLOPATADINE HCL 0.1 % OP SOLN: 1.0000 [drp] | Freq: Two times a day (BID) | OPHTHALMIC | 1 refills | Status: DC

## 2022-10-30 MED ORDER — FLUTICASONE PROPIONATE 50 MCG/ACT NA SUSP: 2.0000 | Freq: Every day | NASAL | 2 refills | Status: DC

## 2022-10-30 NOTE — ED Triage Notes (Signed)
Per  Interpreter Rudell Cobb [phone]- Patient c/o itching to her eyes, face, and ears, sore throat, and nasal congestion x 2-3 weeks and symptoms worse in the past week.  Patient states she has taken Benadryl for her symptoms. The last dose was at last night.

## 2022-10-30 NOTE — ED Provider Notes (Signed)
[facility]    CSN: [id number] Arrival date & time: 10/30/22  0254      History   Chief Complaint Chief Complaint  Patient presents with   Sore Throat   Nasal Congestion   eye itching    HPI Dana Pope is a 47 y.o. female.  Medical interpreter used for this encounter Here with 2 to 3-week history of itchy eyes, runny nose and congestion.  She feels symptoms have worsened over the last week.  Also having sore throat, 5/10 pain with swallowing No fever or cough. No rash. Reports history of seasonal allergies. Tried benadryl but it makes her drowsy.  She did try over-the-counter eyedrops without relief  Past Medical History:  Diagnosis Date   Back pain    Migraine    Vision abnormalities     Patient Active Problem List   Diagnosis Date Noted   Strain of right trapezius muscle 03/15/2018   Abdominal pain 01/11/2018   Fibrocystic breast changes of both breasts 12/09/2014   Toenail deformity 10/30/2014   DUB (dysfunctional uterine bleeding) 03/05/2014   Decreased libido 03/05/2014   Benign positional vertigo 12/21/2010   PALPITATIONS 08/03/2008   OVERWEIGHT 01/06/2008   DEPRESSION 01/06/2008   Back pain 01/06/2008   Headache 01/06/2008    Past Surgical History:  Procedure Laterality Date   CESAREAN SECTION     X4   TUBAL LIGATION      OB History     Gravida  4   Para  4   Term  4   Preterm  0   AB  0   Living  4      SAB  0   IAB  0   Ectopic  0   Multiple      Live Births  4            Home Medications    Prior to Admission medications   Medication Sig Start Date End Date Taking? Authorizing Provider  cetirizine (ZYRTEC ALLERGY) 10 MG tablet Take 1 tablet (10 mg total) by mouth daily. 10/30/22  Yes Ellery Meroney, Lurena Joiner, PA-C  fluticasone (FLONASE) 50 MCG/ACT nasal spray Place 2 sprays into both nostrils daily. 10/30/22  Yes Talisa Petrak, Lurena Joiner, PA-C  olopatadine (PATANOL) 0.1 % ophthalmic solution Place 1 drop  into both eyes 2 (two) times daily. 10/30/22  Yes Zineb Glade, Lurena Joiner, PA-C  acetaminophen (TYLENOL) 500 MG tablet Take 1,000 mg by mouth every 6 (six) hours as needed.    [provider]    Family History Family History  Problem Relation Age of Onset   Diabetes Mother    Hypertension Mother    Cancer Maternal Grandmother        UTERINE   Cancer Paternal Grandmother        SKIN   Hypertension Father    Healthy Sister    Healthy Brother    Healthy Sister    Healthy Sister    Healthy Sister    Healthy Sister    Healthy Sister    Healthy Brother    Healthy Brother    Healthy Brother     Social History Social History   Tobacco Use   Smoking status: Unknown   Smokeless tobacco: Never  Vaping Use   Vaping Use: Never used  Substance Use Topics   Alcohol use: Not Currently    Alcohol/week: 0.0 standard drinks of alcohol   Drug use: No     Allergies   Amoxicillin  Review of Systems Review of Systems As per HPI  Physical Exam Triage Vital Signs ED Triage Vitals  Enc Vitals Group     BP 10/30/22 1245 120/79     Pulse Rate 10/30/22 1245 63     Resp 10/30/22 1245 16     Temp 10/30/22 1245 97.7 F (36.5 C)     Temp Source 10/30/22 1245 Oral     SpO2 10/30/22 1245 98 %     Weight --      Height --      Head Circumference --      Peak Flow --      Pain Score 10/30/22 1136 5     Pain Loc --      Pain Edu? --      Excl. in GC? --    No data found.  Updated Vital Signs BP 120/79 (BP Location: Right Arm)   Pulse 63   Temp 97.7 F (36.5 C) (Oral)   Resp 16   LMP 10/23/2022   SpO2 98%   Physical Exam Vitals and nursing note reviewed.  Constitutional:      General: She is not in acute distress. HENT:     Right Ear: Tympanic membrane and ear canal normal.     Left Ear: Tympanic membrane and ear canal normal.     Nose: No congestion or rhinorrhea.     Mouth/Throat:     Mouth: Mucous membranes are moist.     Pharynx: Oropharynx is clear. Posterior  oropharyngeal erythema present. No pharyngeal swelling or oropharyngeal exudate.     Comments: Mild erythema  Eyes:     Conjunctiva/sclera: Conjunctivae normal.  Cardiovascular:     Rate and Rhythm: Normal rate and regular rhythm.     Pulses: Normal pulses.     Heart sounds: Normal heart sounds.  Pulmonary:     Effort: Pulmonary effort is normal.     Breath sounds: Normal breath sounds. No wheezing.  Musculoskeletal:     Cervical back: Normal range of motion.  Lymphadenopathy:     Cervical: No cervical adenopathy.  Skin:    General: Skin is warm and dry.  Neurological:     Mental Status: She is alert and oriented to person, place, and time.      UC Treatments / Results  Labs (all labs ordered are listed, but only abnormal results are displayed) Labs Reviewed  POCT RAPID STREP A (OFFICE)    EKG   Radiology No results found.  Procedures Procedures (including critical care time)  Medications Ordered in UC Medications - No data to display  Initial Impression / Assessment and Plan / UC Course  I have reviewed the triage vital signs and the nursing notes.  Pertinent labs & imaging results that were available during my care of the patient were reviewed by me and considered in my medical decision making (see chart for details).  Afebrile, well-appearing Strep test negative, culture pending.  Discussed her symptoms seem consistent with seasonal allergies vs upper respiratory virus.  Recommend starting once daily Zyrtec.  Discussed this should not make her drowsy like the Benadryl.  Add daily nasal spray. Tylenol for sore throat. Sent allergy eyedrops to try BID.  Can return if symptoms are persisting or worsen.  Discussed how to set up with a primary care online.  Return precautions verbalized  Final Clinical Impressions(s) / UC Diagnoses   Final diagnoses:  Seasonal allergies     Discharge Instructions  I recommend to take once daily Zyrtec in combination with  using the Flonase (nasal spray) once daily.  The combination of these medicine should help with congestion, runny nose, itching. You can also use the allergy eyedrops twice daily.  This should help with itchy eyes as well. Please return if symptoms are persisting Scan the QR code on the last page to set yourself up with a primary care provider  Recomiendo tomar Zyrtec una vez al da en combinacin con el uso de Flonase (aerosol nasal) Pollyann Savoy al da. La combinacin de estos medicamentos debera ayudar con la congestin, la secrecin nasal y Higher education careers adviser. Tambin puede [employer] gotas para los ojos contra la [employer] veces al da. Esto tambin debera ayudar con la picazn en los ojos. Tambin use Tylenol 650 mg cada 6 horas para el dolor de garganta. Por favor regrese si los sntomas persisten Escanee el cdigo QR en la ltima pgina para conectarse con un proveedor de Marine scientist    ED Prescriptions     Medication Sig Dispense Auth. Provider   cetirizine (ZYRTEC ALLERGY) 10 MG tablet Take 1 tablet (10 mg total) by mouth daily. 30 tablet Manoah Deckard, PA-C   fluticasone (FLONASE) 50 MCG/ACT nasal spray Place 2 sprays into both nostrils daily. 9.9 mL Rhanda Lemire, PA-C   olopatadine (PATANOL) 0.1 % ophthalmic solution Place 1 drop into both eyes 2 (two) times daily. 5 mL Avonne Berkery, Lurena Joiner, PA-C      PDMP not reviewed this encounter.   Marlow Baars, [place] 10/30/22 1317

## 2022-10-30 NOTE — Discharge Instructions (Addendum)
I recommend to take once daily Zyrtec in combination with using the Flonase (nasal spray) once daily.  The combination of these medicine should help with congestion, runny nose, itching. You can also use the allergy eyedrops twice daily.  This should help with itchy eyes as well. Please return if symptoms are persisting Scan the QR code on the last page to set yourself up with a primary care provider  Recomiendo tomar Zyrtec una vez al da en combinacin con el uso de Flonase (aerosol nasal) Pollyann Savoy al da. La combinacin de estos medicamentos debera ayudar con la congestin, la secrecin nasal y Higher education careers adviser. Tambin puede [employer] gotas para los ojos contra la [employer] veces al da. Esto tambin debera ayudar con la picazn en los ojos. Tambin use Tylenol 650 mg cada 6 horas para el dolor de garganta. Por favor regrese si los sntomas persisten Escanee el cdigo QR en la ltima pgina para conectarse con un proveedor de atencin primaria

## 2023-03-26 ENCOUNTER — Ambulatory Visit (HOSPITAL_COMMUNITY)
Admission: EM | Admit: 2023-03-26 | Discharge: 2023-03-26 | Disposition: A | Discharge status: Home / Self Care | Payer: Medicaid Other | Attending: Internal Medicine | Admitting: Internal Medicine

## 2023-03-26 ENCOUNTER — Encounter (HOSPITAL_COMMUNITY): Payer: Self-pay

## 2023-03-26 DIAGNOSIS — J302 Other seasonal allergic rhinitis: Secondary | ICD-10-CM

## 2023-03-26 MED ORDER — CETIRIZINE HCL 10 MG PO TABS: 10.0000 mg | ORAL_TABLET | Freq: Every day | ORAL | 2 refills | Status: AC

## 2023-03-26 MED ORDER — ALBUTEROL SULFATE HFA 108 (90 BASE) MCG/ACT IN AERS: 1.0000 | INHALATION_SPRAY | Freq: Four times a day (QID) | RESPIRATORY_TRACT | 0 refills | Status: AC | PRN

## 2023-03-26 MED ORDER — FLUTICASONE PROPIONATE 50 MCG/ACT NA SUSP: 2.0000 | Freq: Every day | NASAL | 2 refills | Status: AC

## 2023-03-26 NOTE — ED Triage Notes (Signed)
Pt reports sinus pressure and headache x 3 days, Pt states she was expose to smoke on the job and has SOB. Pt use an old inhaler last night.

## 2023-03-26 NOTE — Discharge Instructions (Signed)
Please take medications and use inhaler as directed Your lung exam, ear exam and throat exam are reassuring You may take a few days to a week to see significant improvement in your symptoms Humidifier use will help with nasal congestion and ear fullness. If you have worsening symptoms please return to urgent care to be reevaluated.  [employer] y use Therapist, nutritional segn las indicaciones. Su examen de pulmn, examen de odo y examen de garganta son tranquilizadores Es posible que necesite de [employer] a una semana para ver una mejora significativa en sus sntomas. El uso de humidificador ayudar con la congestin nasal y la plenitud de los odos. Si sus sntomas empeoran, regrese a atencin de [place] para ser [employer].

## 2023-03-27 NOTE — ED Provider Notes (Signed)
[facility]    CSN: [id number] Arrival date & time: 03/26/23  1858      History   Chief Complaint Chief Complaint  Patient presents with   Vomiting   Facial Pain   Ear Pain    HPI Dana Pope is a 47 y.o. female with history of seasonal allergies comes to the urgent care with sinus pressure, headache and intermittent shortness of breath.  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  Following that exposure, the patient has been experiencing sinus pressure with no nasal congestion, postnasal drainage or cough.  Patient denies any fever or chills.  No chest pain or chest pressure.  She has chest tightness but denies any wheezing.  No cough or sputum production.  Patient has ran out of his inhalers.  She is currently not taking any allergy medications.  She also complains of pressure in both the ears.  She denies ringing in the ears or difficulty hearing.  No dizziness or loss of balance. HPI  Past Medical History:  Diagnosis Date   Back pain    Migraine    Vision abnormalities     Patient Active Problem List   Diagnosis Date Noted   Strain of right trapezius muscle 03/15/2018   Abdominal pain 01/11/2018   Fibrocystic breast changes of both breasts 12/09/2014   Toenail deformity 10/30/2014   DUB (dysfunctional uterine bleeding) 03/05/2014   Decreased libido 03/05/2014   Benign positional vertigo 12/21/2010   PALPITATIONS 08/03/2008   OVERWEIGHT 01/06/2008   DEPRESSION 01/06/2008   Back pain 01/06/2008   Headache 01/06/2008    Past Surgical History:  Procedure Laterality Date   CESAREAN SECTION     X4   TUBAL LIGATION      OB History     Gravida  4   Para  4   Term  4   Preterm  0   AB  0   Living  4      SAB  0   IAB  0   Ectopic  0   Multiple      Live Births  4            Home Medications    Prior to Admission medications    Medication Sig Start Date End Date Taking? Authorizing Provider  albuterol (VENTOLIN HFA) 108 (90 Base) MCG/ACT inhaler Inhale 1-2 puffs into the lungs every 6 (six) hours as needed for wheezing or shortness of breath. 03/26/23  Yes Iann Rodier, Britta Mccreedy, MD  acetaminophen (TYLENOL) 500 MG tablet Take 1,000 mg by mouth every 6 (six) hours as needed.    [provider]  cetirizine (ZYRTEC ALLERGY) 10 MG tablet Take 1 tablet (10 mg total) by mouth daily. 03/26/23   Merrilee Jansky, MD  fluticasone (FLONASE) 50 MCG/ACT nasal spray Place 2 sprays into both nostrils daily. 03/26/23   Waleed Dettman, Britta Mccreedy, MD  olopatadine (PATANOL) 0.1 % ophthalmic solution Place 1 drop into both eyes 2 (two) times daily. 10/30/22   Rising, Lurena Joiner PA-C    Family History Family History  Problem Relation Age of Onset   Diabetes Mother    Hypertension Mother    Cancer Maternal Grandmother        UTERINE   Cancer Paternal Grandmother        SKIN   Hypertension Father    Healthy Sister    Healthy Brother  Healthy Sister    Healthy Sister    Healthy Sister    Healthy Sister    Healthy Sister    Healthy Brother    Healthy Brother    Healthy Brother     Social History Social History   Tobacco Use   Smoking status: Unknown   Smokeless tobacco: Never  Vaping Use   Vaping status: Never Used  Substance Use Topics   Alcohol use: Not Currently    Alcohol/week: 0.0 standard drinks of alcohol   Drug use: No     Allergies   Amoxicillin   Review of Systems Review of Systems As per HPI  Physical Exam Triage Vital Signs ED Triage Vitals [03/26/23 1947]  Encounter Vitals Group     BP 126/79     Systolic BP Percentile      Diastolic BP Percentile      Pulse Rate 65     Resp 18     Temp 97.9 F (36.6 C)     Temp Source Oral     SpO2 98 %     Weight      Height      Head Circumference      Peak Flow      Pain Score      Pain Loc      Pain Education      Exclude from Growth Chart     No data found.  Updated Vital Signs BP 126/79 (BP Location: Left Arm)   Pulse 65   Temp 97.9 F (36.6 C) (Oral)   Resp 18   LMP 03/26/2023 (Approximate)   SpO2 98%   Visual Acuity Right Eye Distance:   Left Eye Distance:   Bilateral Distance:    Right Eye Near:   Left Eye Near:    Bilateral Near:     Physical Exam Vitals and nursing note reviewed.  Constitutional:      General: She is not in acute distress.    Appearance: She is not ill-appearing.  HENT:     Right Ear: Tympanic membrane and ear canal normal.     Left Ear: Tympanic membrane and ear canal normal.     Mouth/Throat:     Mouth: Mucous membranes are moist.     Pharynx: No oropharyngeal exudate or posterior oropharyngeal erythema.  Cardiovascular:     Rate and Rhythm: Normal rate and regular rhythm.     Pulses: Normal pulses.     Heart sounds: Normal heart sounds.  Pulmonary:     Effort: Pulmonary effort is normal.     Breath sounds: Normal breath sounds.  Abdominal:     General: Bowel sounds are normal.     Palpations: Abdomen is soft.  Neurological:     Mental Status: She is alert.      UC Treatments / Results  Labs (all labs ordered are listed, but only abnormal results are displayed) Labs Reviewed - No data to display  EKG   Radiology No results found.  Procedures Procedures (including critical care time)  Medications Ordered in UC Medications - No data to display  Initial Impression / Assessment and Plan / UC Course  I have reviewed the triage vital signs and the nursing notes.  Pertinent labs & imaging results that were available during my care of the patient were reviewed by me and considered in my medical decision making (see chart for details).     1.  Seasonal allergic rhinitis: Humidifier with VapoRub use is recommended  Albuterol inhaler was refilled Zyrtec and Flonase were refilled Patient's lung exam is reassuring. Return precautions given. Final Clinical  Impressions(s) / UC Diagnoses   Final diagnoses:  Seasonal allergic rhinitis, unspecified trigger     Discharge Instructions      Please take medications and use inhaler as directed Your lung exam, ear exam and throat exam are reassuring You may take a few days to a week to see significant improvement in your symptoms Humidifier use will help with nasal congestion and ear fullness. If you have worsening symptoms please return to urgent care to be reevaluated.  [employer] y use Therapist, nutritional segn las indicaciones. Su examen de pulmn, examen de odo y examen de garganta son tranquilizadores Es posible que necesite de [employer] a una semana para ver una mejora significativa en sus sntomas. El uso de humidificador ayudar con la congestin nasal y la plenitud de los odos. Si sus sntomas empeoran, regrese a atencin de [place] para ser [employer].   ED Prescriptions     Medication Sig Dispense Auth. Provider   albuterol (VENTOLIN HFA) 108 (90 Base) MCG/ACT inhaler Inhale 1-2 puffs into the lungs every 6 (six) hours as needed for wheezing or shortness of breath. 6.7 g Sinda Leedom, Britta Mccreedy, MD   cetirizine (ZYRTEC ALLERGY) 10 MG tablet Take 1 tablet (10 mg total) by mouth daily. 30 tablet Finbar Nippert, Britta Mccreedy, MD   fluticasone (FLONASE) 50 MCG/ACT nasal spray Place 2 sprays into both nostrils daily. 9.9 mL Dorotea Hand, Britta Mccreedy, MD      PDMP not reviewed this encounter.   Merrilee Jansky, MD 03/27/23 1430

## 2023-04-18 ENCOUNTER — Encounter: Payer: Self-pay | Admitting: Nurse Practitioner

## 2023-04-18 ENCOUNTER — Ambulatory Visit: Payer: Medicaid Other | Admitting: Nurse Practitioner

## 2023-04-18 VITALS — BP 109/71 | HR 71 | Temp 97.9°F | Ht 63.0 in | Wt 168.8 lb

## 2023-04-18 DIAGNOSIS — F32A Depression, unspecified: Secondary | ICD-10-CM | POA: Insufficient documentation

## 2023-04-18 DIAGNOSIS — Z1329 Encounter for screening for other suspected endocrine disorder: Secondary | ICD-10-CM | POA: Diagnosis not present

## 2023-04-18 DIAGNOSIS — Z130 Encounter for screening for diseases of the blood and blood-forming organs and certain disorders involving the immune mechanism: Secondary | ICD-10-CM

## 2023-04-18 DIAGNOSIS — Z1211 Encounter for screening for malignant neoplasm of colon: Secondary | ICD-10-CM

## 2023-04-18 DIAGNOSIS — Z23 Encounter for immunization: Secondary | ICD-10-CM | POA: Diagnosis not present

## 2023-04-18 DIAGNOSIS — Z13228 Encounter for screening for other metabolic disorders: Secondary | ICD-10-CM

## 2023-04-18 DIAGNOSIS — F419 Anxiety disorder, unspecified: Secondary | ICD-10-CM

## 2023-04-18 DIAGNOSIS — T7840XA Allergy, unspecified, initial encounter: Secondary | ICD-10-CM | POA: Insufficient documentation

## 2023-04-18 DIAGNOSIS — R634 Abnormal weight loss: Secondary | ICD-10-CM | POA: Diagnosis not present

## 2023-04-18 DIAGNOSIS — Z1321 Encounter for screening for nutritional disorder: Secondary | ICD-10-CM

## 2023-04-18 LAB — POCT GLYCOSYLATED HEMOGLOBIN (HGB A1C): Hemoglobin A1C: 5.1 % (ref 4.0–5.6)

## 2023-04-18 MED ORDER — ESCITALOPRAM OXALATE 10 MG PO TABS: 10.0000 mg | ORAL_TABLET | Freq: Every day | ORAL | 0 refills | Status: DC

## 2023-04-18 NOTE — Patient Instructions (Addendum)
1. Screening for colon cancer  - Cologuard  2. Need for influenza vaccination  - Flu vaccine trivalent PF, 6mos and older(Flulaval,Afluria,Fluarix,Fluzone)  3. Anxiety and depression Please start taking Lexapro 10 mg daily.  I have referred you for therapy  4. Screening for endocrine, nutritional, metabolic and immunity disorder  - HIV Antibody (routine testing w rflx) - Hepatitis C antibody - TSH - CBC - CMP14+EGFR     It is important that you exercise regularly at least 30 minutes 5 times a week as tolerated  Think about what you will eat, plan ahead. Choose " clean, green, fresh or frozen" over canned, processed or packaged foods which are more sugary, salty and fatty. 70 to 75% of food eaten should be vegetables and fruit. Three meals at set times with snacks allowed between meals, but they must be fruit or vegetables. Aim to eat over a 12 hour period , example 7 am to 7 pm, and STOP after  your last meal of the day. Drink water,generally about 64 ounces per day, no other drink is as healthy. Fruit juice is best enjoyed in a healthy way, by EATING the fruit.  Thanks for choosing [facility] we consider it a privelige to serve you.

## 2023-04-18 NOTE — Assessment & Plan Note (Signed)
Wt Readings from Last 3 Encounters:  04/18/23 168 lb 12.8 oz (76.6 kg)  08/17/22 178 lb (80.7 kg)  07/25/21 186 lb 12.8 oz (84.7 kg)   Her anxiety and depression could be contributing to this Checking labs  5. Unintentional weight loss  - HIV Antibody (routine testing w rflx) - Hepatitis C antibody - TSH - CBC - CMP14+EGFR - POCT glycosylated hemoglobin (Hb A1C)

## 2023-04-18 NOTE — Progress Notes (Signed)
New Patient Office Visit  Subjective:  Patient ID: Dana Pope, female    DOB: 18-Oct-1975  Age: 47 y.o. MRN: [id number]  CC:  Chief Complaint  Patient presents with  . Establish Care    HPI Dana Pope is a 47 y.o. female  has a past medical history of Allergy, Back pain, Benign positional vertigo (12/21/2010), Depression, Migraine, and Vision abnormalities.  Patient presents to establish care for her medical conditions.  Stated that she was seeing a doctor at her workplace, last visit to that doctor was less than a year ago.  She is up-to-date with cervical cancer screening and mammogram.  Cologuard ordered to screen for colon cancer.  Stated that she has had a Tdap vaccine in the past 10 years  Patient complains of loss of appetite, unintentional weight loss.  She denies fever, malaise, chest pain, shortness of breath abdominal pain, nausea, vomiting.   Has depression and anxiety due to life issues.  Stated that she was on medication for depression in the past but not currently.  She denies SI, HI.    Interpretation services provided via [place] iPad    Wt Readings from Last 3 Encounters:  04/18/23 168 lb 12.8 oz (76.6 kg)  08/17/22 178 lb (80.7 kg)  07/25/21 186 lb 12.8 oz (84.7 kg)     Period  every month.   Past Medical History:  Diagnosis Date  . Allergy   . Back pain   . Benign positional vertigo 12/21/2010  . Depression   . Migraine   . Vision abnormalities     Past Surgical History:  Procedure Laterality Date  . CESAREAN SECTION     X4  . TUBAL LIGATION      Family History  Problem Relation Age of Onset  . Diabetes Mother   . Hypertension Mother   . Hypertension Father   . Healthy Sister   . Healthy Sister   . Healthy Sister   . Healthy Sister   . Healthy Sister   . Healthy Sister   . Healthy Brother   . Healthy Brother   . Healthy Brother   . Healthy Brother   . Cancer Maternal Grandmother        UTERINE  .  Cancer Paternal Grandmother        SKIN  . Stroke Neg Hx   . Breast cancer Neg Hx     Social History   Socioeconomic History  . Marital status: Significant Other    Spouse name: Not on file  . Number of children: 4  . Years of education: 7  . Highest education level: Not on file  Occupational History  . Not on file  Tobacco Use  . Smoking status: Never  . Smokeless tobacco: Never  Vaping Use  . Vaping status: Never Used  Substance and Sexual Activity  . Alcohol use: Not Currently    Alcohol/week: 0.0 standard drinks of alcohol  . Drug use: No  . Sexual activity: Not Currently    Partners: Male    Birth control/protection: Surgical    Comment: 1st intercourse- 18, partners- 3, btl  Other Topics Concern  . Not on file  Social History Narrative   Lives with her significant other . [place]   Social Determinants of Health   Financial Resource Strain: Not on File (10/26/2021)   Received from [facility], [employer]   . Financial Resource Strain:  0  Food Insecurity: Not on File (10/26/2021)   Received from [facility], [employer]   . Food: 0  Transportation Needs: Not on File (10/26/2021)   Received from [facility], [employer]   . Transportation: 0  Physical Activity: Not on File (10/26/2021)   Received from [place], [place]   Physical Activity   . Physical Activity: 0  Stress: Not on File (10/26/2021)   Received from [place], [place]   Stress   . Stress: 0  Social Connections: Not on File (10/26/2021)   Received from [place], [place]   Social Connections   . Social Connections and Isolation: 0  Intimate Partner Violence: Not on file    ROS Review of Systems  Constitutional:  Positive for appetite change and unexpected weight change. Negative for activity change, chills, fatigue and fever.  HENT:  Negative for congestion, dental problem, ear discharge, ear pain and hearing loss.    Eyes:  Negative for pain, redness and itching.  Respiratory:  Negative for cough, chest tightness, shortness of breath and wheezing.   Cardiovascular:  Negative for chest pain, palpitations and leg swelling.  Gastrointestinal:  Negative for abdominal distention, abdominal pain, anal bleeding, blood in stool, constipation, diarrhea, nausea, rectal pain and vomiting.  Endocrine: Negative for polydipsia, polyphagia and polyuria.  Genitourinary:  Negative for difficulty urinating, dysuria, flank pain, frequency, hematuria, menstrual problem, pelvic pain and vaginal bleeding.  Musculoskeletal:  Negative for arthralgias, back pain, gait problem, joint swelling and myalgias.  Skin:  Negative for color change, pallor, rash and wound.  Allergic/Immunologic: Negative for immunocompromised state.  Neurological:  Negative for dizziness, tremors, facial asymmetry, weakness and headaches.  Hematological:  Negative for adenopathy. Does not bruise/bleed easily.  Psychiatric/Behavioral:  Negative for agitation, behavioral problems, confusion, decreased concentration, hallucinations, self-injury and suicidal ideas.     Objective:   Today's Vitals: BP 109/71   Pulse 71   Temp 97.9 F (36.6 C)   Ht 5\' 3"  (1.6 m)   Wt 168 lb 12.8 oz (76.6 kg)   LMP 03/26/2023 (Approximate)   SpO2 100%   BMI 29.90 kg/m   Physical Exam Vitals and nursing note reviewed.  Constitutional:      General: She is not in acute distress.    Appearance: Normal appearance. She is not ill-appearing, toxic-appearing or diaphoretic.  HENT:     Mouth/Throat:     Mouth: Mucous membranes are moist.     Pharynx: Oropharynx is clear. No oropharyngeal exudate or posterior oropharyngeal erythema.  Eyes:     General: No scleral icterus.       Right eye: No discharge.        Left eye: No discharge.     Extraocular Movements: Extraocular movements intact.     Conjunctiva/sclera: Conjunctivae normal.  Cardiovascular:     Rate and  Rhythm: Normal rate and regular rhythm.     Pulses: Normal pulses.     Heart sounds: Normal heart sounds. No murmur heard.    No friction rub. No gallop.  Pulmonary:     Effort: Pulmonary effort is normal. No respiratory distress.     Breath sounds: Normal breath sounds. No stridor. No wheezing, rhonchi or rales.  Chest:     Chest wall: No tenderness.  Abdominal:     General: There is no distension.     Palpations: Abdomen is soft.     Tenderness: There is no abdominal tenderness. There is no right CVA tenderness, left CVA tenderness  or guarding.  Musculoskeletal:        General: No swelling, tenderness, deformity or signs of injury.     Right lower leg: No edema.     Left lower leg: No edema.  Skin:    General: Skin is warm and dry.     Capillary Refill: Capillary refill takes less than 2 seconds.     Coloration: Skin is not jaundiced or pale.     Findings: No bruising, erythema or lesion.  Neurological:     Mental Status: She is alert and oriented to person, place, and time.     Motor: No weakness.     Coordination: Coordination normal.     Gait: Gait normal.  Psychiatric:        Mood and Affect: Mood normal.        Behavior: Behavior normal.        Thought Content: Thought content normal.        Judgment: Judgment normal.    Assessment & Plan:   Problem List Items Addressed This Visit       Other   Anxiety and depression - Primary    Flowsheet Row Office Visit from 04/18/2023 in [facility]  PHQ-9 Total Score 13          04/18/2023    2:44 PM 04/18/2023    2:17 PM  GAD 7 : Generalized Anxiety Score  Nervous, Anxious, on Edge 1 1  Control/stop worrying 1 0  Worry too much - different things 3 3  Trouble relaxing 1 1  Restless 0 0  Easily annoyed or irritable 1 0  Afraid - awful might happen 1 1  Total GAD 7 Score 8 6  Anxiety Difficulty Not difficult at all Not difficult at all  Start Lexapro 10 mg daily Patient referred for  counseling Encouraged to seek urgent care at [facility] behavioral health care clinic for crisis Follow-up in the office in 6 weeks        Relevant Medications   escitalopram (LEXAPRO) 10 MG tablet   Screening for colon cancer   Relevant Orders   Cologuard   Need for influenza vaccination    Patient educated on CDC recommendation for the vaccine. Verbal consent was obtained from the patient, vaccine administered by nurse, no sign of adverse reactions noted at this time. Patient education on arm soreness and use of tylenol or for this patient  was discussed. Patient educated on the signs and symptoms of adverse effect and advise to contact the office if they occur. Vaccine information sheet given to patient.        Relevant Orders   Flu vaccine trivalent PF, 6mos and older(Flulaval,Afluria,Fluarix,Fluzone) (Completed)   Unintentional weight loss    Wt Readings from Last 3 Encounters:  04/18/23 168 lb 12.8 oz (76.6 kg)  08/17/22 178 lb (80.7 kg)  07/25/21 186 lb 12.8 oz (84.7 kg)   Her anxiety and depression could be contributing to this Checking labs  5. Unintentional weight loss  - HIV Antibody (routine testing w rflx) - Hepatitis C antibody - TSH - CBC - CMP14+EGFR - POCT glycosylated hemoglobin (Hb A1C)       Relevant Orders   HIV Antibody (routine testing w rflx)   Hepatitis C antibody   TSH   CBC   CMP14+EGFR   POCT glycosylated hemoglobin (Hb A1C) (Completed)   Other Visit Diagnoses     Screening for endocrine, nutritional, metabolic and immunity disorder  Relevant Orders   HIV Antibody (routine testing w rflx)   Hepatitis C antibody   TSH   CBC   CMP14+EGFR       Outpatient Encounter Medications as of 04/18/2023  Medication Sig  . acetaminophen (TYLENOL) 500 MG tablet Take 1,000 mg by mouth every 6 (six) hours as needed.  Marland Kitchen albuterol (VENTOLIN HFA) 108 (90 Base) MCG/ACT inhaler Inhale 1-2 puffs into the lungs every 6 (six) hours as needed  for wheezing or shortness of breath.  . cetirizine (ZYRTEC ALLERGY) 10 MG tablet Take 1 tablet (10 mg total) by mouth daily.  Marland Kitchen escitalopram (LEXAPRO) 10 MG tablet Take 1 tablet (10 mg total) by mouth daily.  . fluticasone (FLONASE) 50 MCG/ACT nasal spray Place 2 sprays into both nostrils daily.  Marland Kitchen olopatadine (PATANOL) 0.1 % ophthalmic solution Place 1 drop into both eyes 2 (two) times daily. (Patient not taking: Reported on 04/18/2023)   No facility-administered encounter medications on file as of 04/18/2023.    Follow-up: Return in about 6 weeks (around 05/30/2023) for F/U WITH SUSAN FOR COUNSELING, ANXIETY, DEPRESSION.   Donell Beers, FNP

## 2023-04-18 NOTE — Assessment & Plan Note (Signed)
Patient educated on CDC recommendation for the vaccine. Verbal consent was obtained from the patient, vaccine administered by nurse, no sign of adverse reactions noted at this time. Patient education on arm soreness and use of tylenol or  for this patient  was discussed. Patient educated on the signs and symptoms of adverse effect and advise to contact the office if they occur. Vaccine information sheet given to patient.

## 2023-04-18 NOTE — Assessment & Plan Note (Signed)
Flowsheet Row Office Visit from 04/18/2023 in [facility]  PHQ-9 Total Score 13          04/18/2023    2:44 PM 04/18/2023    2:17 PM  GAD 7 : Generalized Anxiety Score  Nervous, Anxious, on Edge 1 1  Control/stop worrying 1 0  Worry too much - different things 3 3  Trouble relaxing 1 1  Restless 0 0  Easily annoyed or irritable 1 0  Afraid - awful might happen 1 1  Total GAD 7 Score 8 6  Anxiety Difficulty Not difficult at all Not difficult at all  Start Lexapro 10 mg daily Patient referred for counseling Encouraged to seek urgent care at [facility] behavioral health care clinic for crisis Follow-up in the office in 6 weeks

## 2023-04-19 LAB — CMP14+EGFR
ALT: 16 [IU]/L (ref 0–32)
AST: 20 [IU]/L (ref 0–40)
Albumin: 4.5 g/dL (ref 3.9–4.9)
Alkaline Phosphatase: 73 [IU]/L (ref 44–121)
BUN/Creatinine Ratio: 22 (ref 9–23)
BUN: 15 mg/dL (ref 6–24)
Bilirubin Total: 0.4 mg/dL (ref 0.0–1.2)
CO2: 24 mmol/L (ref 20–29)
Calcium: 9.7 mg/dL (ref 8.7–10.2)
Chloride: 102 mmol/L (ref 96–106)
Creatinine, Ser: 0.69 mg/dL (ref 0.57–1.00)
Globulin, Total: 2.6 g/dL (ref 1.5–4.5)
Glucose: 91 mg/dL (ref 70–99)
Potassium: 4.3 mmol/L (ref 3.5–5.2)
Sodium: 138 mmol/L (ref 134–144)
Total Protein: 7.1 g/dL (ref 6.0–8.5)
eGFR: 108 mL/min/{1.73_m2} (ref >59)

## 2023-04-19 LAB — CBC
Hematocrit: 43.5 % (ref 34.0–46.6)
Hemoglobin: 14 g/dL (ref 11.1–15.9)
MCH: 29.8 pg (ref 26.6–33.0)
MCHC: 32.2 g/dL (ref 31.5–35.7)
MCV: 93 fL (ref 79–97)
Platelets: 288 10*3/uL (ref 150–450)
RBC: 4.7 x10E6/uL (ref 3.77–5.28)
RDW: 11.8 % (ref 11.7–15.4)
WBC: 5.7 10*3/uL (ref 3.4–10.8)

## 2023-04-19 LAB — HIV ANTIBODY (ROUTINE TESTING W REFLEX): HIV Screen 4th Generation wRfx: NONREACTIVE

## 2023-04-19 LAB — HEPATITIS C ANTIBODY: Hep C Virus Ab: NONREACTIVE

## 2023-04-19 LAB — TSH: TSH: 1.01 u[IU]/mL (ref 0.450–4.500)

## 2023-04-27 ENCOUNTER — Ambulatory Visit (HOSPITAL_COMMUNITY)
Admission: EM | Admit: 2023-04-27 | Discharge: 2023-04-27 | Disposition: A | Discharge status: Home / Self Care | Payer: Medicaid Other | Attending: Emergency Medicine | Admitting: Emergency Medicine

## 2023-04-27 ENCOUNTER — Encounter (HOSPITAL_COMMUNITY): Payer: Self-pay | Admitting: *Deleted

## 2023-04-27 DIAGNOSIS — H01004 Unspecified blepharitis left upper eyelid: Secondary | ICD-10-CM | POA: Diagnosis not present

## 2023-04-27 MED ORDER — ERYTHROMYCIN 5 MG/GM OP OINT: TOPICAL_OINTMENT | OPHTHALMIC | 0 refills | Status: DC

## 2023-04-27 NOTE — ED Triage Notes (Signed)
Pt states her left eye swollen X 4 days. She has been using OTC drops and warm compress.

## 2023-04-27 NOTE — Discharge Instructions (Addendum)
Contine usando compresas tibias durante todo el da, seque los ojos con palmaditas y use la pomada segn lo prescrito. Evite el maquillaje hasta que la inflamacin se haya curado por completo y haya terminado con los antibiticos. Puede tomar 800 mg de ibuprofeno cada 8 horas segn sea necesario para el dolor y la inflamacin.  Por favor, consulte con un oftalmlogo si los sntomas persisten a pesar de estas intervenciones. Regrese a la clnica si tiene sntomas nuevos o urgentes. Busque atencin mdica inmediata si presenta prdida de visin, fiebre, propagacin del enrojecimiento o hinchazn.  Continue to use warm compresses throughout the day, pat the eyes dry and use the ointment as prescribed.  Avoid make-up until the inflammation has fully resolved and you have finished your antibiotics.  You can take 800 mg of ibuprofen every 8 hours as needed for pain and inflammation.  Please follow-up with an ophthalmologist if your symptoms persist despite these interventions.  Return to clinic for any new or urgent symptoms.  Seek immediate care if you develop vision loss, fever, spreading of the redness or the swelling.

## 2023-04-27 NOTE — ED Provider Notes (Signed)
[facility]    CSN: [id number] Arrival date & time: 04/27/23  0846      History   Chief Complaint Chief Complaint  Patient presents with   Eye Problem    HPI Dana Pope Dana Pope is a 47 y.o. female.   Patient presents to clinic for left upper eyelid swelling that has been ongoing for the past 4 days.  Prior to the eyelid swelling she was wearing mascara.  Reports a few days ago she did get out a hard yellow ball from her upper eyelid. Endorses a scant discharge from the left eye.  She has been using warm compresses and over-the-counter eyedrops, she thinks they are Visine.  Denies any vision changes.  No fevers.  Has had previous issues like this in the past.  The history is provided by the patient and medical records. The history is limited by a language barrier. A language interpreter was used.  Eye Problem   Past Medical History:  Diagnosis Date   Allergy    Back pain    Benign positional vertigo 12/21/2010   Depression    Migraine    Vision abnormalities     Patient Active Problem List   Diagnosis Date Noted   Anxiety and depression 04/18/2023   Screening for colon cancer 04/18/2023   Need for influenza vaccination 04/18/2023   Unintentional weight loss 04/18/2023   Allergy    Strain of right trapezius muscle 03/15/2018   Abdominal pain 01/11/2018   Fibrocystic breast changes of both breasts 12/09/2014   Toenail deformity 10/30/2014   DUB (dysfunctional uterine bleeding) 03/05/2014   Decreased libido 03/05/2014   Benign positional vertigo 12/21/2010   PALPITATIONS 08/03/2008   Overweight 01/06/2008   DEPRESSION 01/06/2008   Back pain 01/06/2008   Headache 01/06/2008    Past Surgical History:  Procedure Laterality Date   CESAREAN SECTION     X4   TUBAL LIGATION      OB History     Gravida  4   Para  4   Term  4   Preterm  0   AB  0   Living  4      SAB  0   IAB  0   Ectopic  0   Multiple      Live Births  4             Home Medications    Prior to Admission medications   Medication Sig Start Date End Date Taking? Authorizing Provider  erythromycin ophthalmic ointment Place a 1/2 inch ribbon of ointment into the lower eyelid 4x daily x5 days. 04/27/23  Yes Rinaldo Ratel, [place] N, FNP  albuterol (VENTOLIN HFA) 108 (90 Base) MCG/ACT inhaler Inhale 1-2 puffs into the lungs every 6 (six) hours as needed for wheezing or shortness of breath. 03/26/23   Merrilee Jansky, MD  cetirizine (ZYRTEC ALLERGY) 10 MG tablet Take 1 tablet (10 mg total) by mouth daily. 03/26/23   Merrilee Jansky, MD  fluticasone (FLONASE) 50 MCG/ACT nasal spray Place 2 sprays into both nostrils daily. 03/26/23   Lamptey, Britta Mccreedy, MD    Family History Family History  Problem Relation Age of Onset   Diabetes Mother    Hypertension Mother    Hypertension Father    Healthy Sister    Healthy Sister    Healthy Sister    Healthy Sister    Healthy Sister    Healthy Sister    Healthy Brother  Healthy Brother    Healthy Brother    Healthy Brother    Cancer Maternal Grandmother        UTERINE   Cancer Paternal Grandmother        SKIN   Stroke Neg Hx    Breast cancer Neg Hx     Social History Social History   Tobacco Use   Smoking status: Never   Smokeless tobacco: Never  Vaping Use   Vaping status: Never Used  Substance Use Topics   Alcohol use: Not Currently    Alcohol/week: 0.0 standard drinks of alcohol   Drug use: No     Allergies   Amoxicillin   Review of Systems Review of Systems  Per HPI  Physical Exam Triage Vital Signs ED Triage Vitals  Encounter Vitals Group     BP 04/27/23 0855 122/75     Systolic BP Percentile --      Diastolic BP Percentile --      Pulse Rate 04/27/23 0855 83     Resp 04/27/23 0855 18     Temp 04/27/23 0855 97.7 F (36.5 C)     Temp Source 04/27/23 0855 Oral     SpO2 04/27/23 0855 98 %     Weight --      Height --      Head Circumference --      Peak Flow  --      Pain Score 04/27/23 0854 6     Pain Loc --      Pain Education --      Exclude from Growth Chart --    No data found.  Updated Vital Signs BP 122/75 (BP Location: Right Arm)   Pulse 83   Temp 97.7 F (36.5 C) (Oral)   Resp 18   LMP 04/18/2023 (Exact Date)   SpO2 98%   Visual Acuity Right Eye Distance:   Left Eye Distance:   Bilateral Distance:    Right Eye Near:   Left Eye Near:    Bilateral Near:     Physical Exam Vitals and nursing note reviewed.  Constitutional:      Appearance: Normal appearance.  HENT:     Head: Normocephalic and atraumatic.     Right Ear: External ear normal.     Left Ear: External ear normal.     Nose: Nose normal.     Mouth/Throat:     Mouth: Mucous membranes are moist.  Eyes:     General: Lids are everted, no foreign bodies appreciated. Vision grossly intact. Gaze aligned appropriately.        Right eye: No discharge.        Left eye: No discharge.     Extraocular Movements: Extraocular movements intact.     Conjunctiva/sclera: Conjunctivae normal.     Left eye: Left conjunctiva is not injected.     Pupils: Pupils are equal, round, and reactive to light.      Comments: Left upper eyelid erythema and swelling.  No obvious stye or hordeolum visualized.  Conjunctiva is clear without discharge.  Cardiovascular:     Rate and Rhythm: Normal rate.  Pulmonary:     Effort: Pulmonary effort is normal. No respiratory distress.  Musculoskeletal:        General: Normal range of motion.  Skin:    General: Skin is warm and dry.  Neurological:     General: No focal deficit present.     Mental Status: She is alert and oriented to person,  place, and time.  Psychiatric:        Mood and Affect: Mood normal.        Behavior: Behavior normal. Behavior is cooperative.      UC Treatments / Results  Labs (all labs ordered are listed, but only abnormal results are displayed) Labs Reviewed - No data to display  EKG   Radiology No  results found.  Procedures Procedures (including critical care time)  Medications Ordered in UC Medications - No data to display  Initial Impression / Assessment and Plan / UC Course  I have reviewed the triage vital signs and the nursing notes.  Pertinent labs & imaging results that were available during my care of the patient were reviewed by me and considered in my medical decision making (see chart for details).  Vitals and triage reviewed, patient is hemodynamically stable.  Patient has blepharitis to left upper eyelid.  Conjunctiva without erythema or drainage.  Suspect secondary to mascara use.  Discussed symptomatic management with warm compresses, will cover with erythromycin to prevent bacterial infection.  Plan of care, follow-up care and return precautions given, no questions at this time.     Final Clinical Impressions(s) / UC Diagnoses   Final diagnoses:  Blepharitis of left upper eyelid, unspecified type     Discharge Instructions      Contine usando compresas tibias durante todo el da, seque los ojos con palmaditas y use la pomada segn lo prescrito. Evite el maquillaje hasta que la inflamacin se haya curado por completo y haya terminado con los antibiticos. Puede tomar 800 mg de ibuprofeno cada 8 horas segn sea necesario para el dolor y la inflamacin.  Por favor, consulte con un oftalmlogo si los sntomas persisten a pesar de estas intervenciones. Regrese a la clnica si tiene sntomas nuevos o urgentes. Busque atencin mdica inmediata si presenta prdida de visin, fiebre, propagacin del enrojecimiento o hinchazn.  Continue to use warm compresses throughout the day, pat the eyes dry and use the ointment as prescribed.  Avoid make-up until the inflammation has fully resolved and you have finished your antibiotics.  You can take 800 mg of ibuprofen every 8 hours as needed for pain and inflammation.  Please follow-up with an ophthalmologist if your symptoms  persist despite these interventions.  Return to clinic for any new or urgent symptoms.  Seek immediate care if you develop vision loss, fever, spreading of the redness or the swelling.      ED Prescriptions     Medication Sig Dispense Auth. Provider   erythromycin ophthalmic ointment Place a 1/2 inch ribbon of ointment into the lower eyelid 4x daily x5 days. 3.5 g Elner Seifert, [place] N, FNP      PDMP not reviewed this encounter.   Rinaldo Ratel [place] N, [place] 04/27/23 [phone]

## 2023-05-02 ENCOUNTER — Institutional Professional Consult (permissible substitution): Payer: Medicaid Other | Admitting: Clinical

## 2023-05-04 ENCOUNTER — Encounter: Payer: Self-pay | Admitting: Nurse Practitioner

## 2023-05-30 ENCOUNTER — Ambulatory Visit: Payer: Self-pay | Admitting: Nurse Practitioner

## 2023-06-12 ENCOUNTER — Ambulatory Visit (INDEPENDENT_AMBULATORY_CARE_PROVIDER_SITE_OTHER): Payer: Medicaid Other

## 2023-06-12 ENCOUNTER — Ambulatory Visit (HOSPITAL_COMMUNITY)
Admission: EM | Admit: 2023-06-12 | Discharge: 2023-06-12 | Disposition: A | Discharge status: Home / Self Care | Payer: Medicaid Other | Attending: Emergency Medicine | Admitting: Emergency Medicine

## 2023-06-12 ENCOUNTER — Encounter (HOSPITAL_COMMUNITY): Payer: Self-pay

## 2023-06-12 DIAGNOSIS — B9689 Other specified bacterial agents as the cause of diseases classified elsewhere: Secondary | ICD-10-CM | POA: Diagnosis not present

## 2023-06-12 DIAGNOSIS — R058 Other specified cough: Secondary | ICD-10-CM

## 2023-06-12 DIAGNOSIS — J019 Acute sinusitis, unspecified: Secondary | ICD-10-CM

## 2023-06-12 MED ORDER — BENZONATATE 100 MG PO CAPS: 100.0000 mg | ORAL_CAPSULE | Freq: Three times a day (TID) | ORAL | 0 refills | Status: DC | PRN

## 2023-06-12 MED ORDER — DOXYCYCLINE HYCLATE 100 MG PO CAPS: 100.0000 mg | ORAL_CAPSULE | Freq: Two times a day (BID) | ORAL | 0 refills | Status: AC

## 2023-06-12 NOTE — ED Provider Notes (Signed)
[facility]    CSN: [id number] Arrival date & time: 06/12/23  1634     History   Chief Complaint Chief Complaint  Patient presents with   Cough    HPI Dana Pope is a 47 y.o. female.  Here with about 2 weeks of nasal congestion and cough Cough is productive, worse over the last few days Denies chest pain/tightness or shortness of breath Had fever at first, but none recently  She reports asthma history, has been using inhaler Also tried nyquil  Coworker was sick a few weeks ago with cough No recent travel  Past Medical History:  Diagnosis Date   Allergy    Back pain    Benign positional vertigo 12/21/2010   Depression    Migraine    Vision abnormalities     Patient Active Problem List   Diagnosis Date Noted   Anxiety and depression 04/18/2023   Screening for colon cancer 04/18/2023   Need for influenza vaccination 04/18/2023   Unintentional weight loss 04/18/2023   Allergy    Strain of right trapezius muscle 03/15/2018   Abdominal pain 01/11/2018   Fibrocystic breast changes of both breasts 12/09/2014   Toenail deformity 10/30/2014   DUB (dysfunctional uterine bleeding) 03/05/2014   Decreased libido 03/05/2014   Benign positional vertigo 12/21/2010   PALPITATIONS 08/03/2008   Overweight 01/06/2008   DEPRESSION 01/06/2008   Back pain 01/06/2008   Headache 01/06/2008    Past Surgical History:  Procedure Laterality Date   CESAREAN SECTION     X4   TUBAL LIGATION      OB History     Gravida  4   Para  4   Term  4   Preterm  0   AB  0   Living  4      SAB  0   IAB  0   Ectopic  0   Multiple      Live Births  4            Home Medications    Prior to Admission medications   Medication Sig Start Date End Date Taking? Authorizing Provider  albuterol (VENTOLIN HFA) 108 (90 Base) MCG/ACT inhaler Inhale 1-2 puffs into the lungs every 6 (six) hours as needed for wheezing or shortness of breath. 03/26/23   Yes Lamptey, Britta Mccreedy, MD  benzonatate (TESSALON) 100 MG capsule Take 1 capsule (100 mg total) by mouth 3 (three) times daily as needed for cough. 06/12/23  Yes Tyvon Eggenberger, Lurena Joiner, PA-C  cetirizine (ZYRTEC ALLERGY) 10 MG tablet Take 1 tablet (10 mg total) by mouth daily. 03/26/23  Yes Lamptey, Britta Mccreedy, MD  doxycycline (VIBRAMYCIN) 100 MG capsule Take 1 capsule (100 mg total) by mouth 2 (two) times daily for 7 days. 06/12/23 06/19/23 Yes Kimmy Parish, Lurena Joiner, PA-C  fluticasone (FLONASE) 50 MCG/ACT nasal spray Place 2 sprays into both nostrils daily. 03/26/23  Yes Lamptey, Britta Mccreedy, MD    Family History Family History  Problem Relation Age of Onset   Diabetes Mother    Hypertension Mother    Hypertension Father    Healthy Sister    Healthy Sister    Healthy Sister    Healthy Sister    Healthy Sister    Healthy Sister    Healthy Brother    Healthy Brother    Healthy Brother    Healthy Brother    Cancer Maternal Grandmother        UTERINE   Cancer Paternal  Grandmother        SKIN   Stroke Neg Hx    Breast cancer Neg Hx     Social History Social History   Tobacco Use   Smoking status: Never   Smokeless tobacco: Never  Vaping Use   Vaping status: Never Used  Substance Use Topics   Alcohol use: Not Currently    Alcohol/week: 0.0 standard drinks of alcohol   Drug use: No     Allergies   Amoxicillin   Review of Systems Review of Systems Per HPI  Physical Exam Triage Vital Signs ED Triage Vitals [06/12/23 1703]  Encounter Vitals Group     BP 125/82     Systolic BP Percentile      Diastolic BP Percentile      Pulse Rate 95     Resp 18     Temp 98 F (36.7 C)     Temp Source Oral     SpO2 95 %     Weight      Height      Head Circumference      Peak Flow      Pain Score      Pain Loc      Pain Education      Exclude from Growth Chart    No data found.  Updated Vital Signs BP 125/82 (BP Location: Left Arm)   Pulse 95   Temp 98 F (36.7 C) (Oral)   Resp 18    LMP 06/07/2023 (Approximate)   SpO2 96%   Physical Exam Vitals and nursing note reviewed.  Constitutional:      General: She is not in acute distress. HENT:     Right Ear: Tympanic membrane and ear canal normal.     Left Ear: Tympanic membrane and ear canal normal.     Nose: Congestion present. No rhinorrhea.     Mouth/Throat:     Mouth: Mucous membranes are moist.     Pharynx: Oropharynx is clear.  Eyes:     Conjunctiva/sclera: Conjunctivae normal.  Cardiovascular:     Rate and Rhythm: Normal rate and regular rhythm.     Pulses: Normal pulses.     Heart sounds: Normal heart sounds.  Pulmonary:     Effort: Pulmonary effort is normal.     Breath sounds: No wheezing.     Comments: Crackles lower lobes Musculoskeletal:     Cervical back: Normal range of motion.  Lymphadenopathy:     Cervical: No cervical adenopathy.  Skin:    General: Skin is warm and dry.  Neurological:     Mental Status: She is alert and oriented to person, place, and time.     UC Treatments / Results  Labs (all labs ordered are listed, but only abnormal results are displayed) Labs Reviewed - No data to display  EKG   Radiology No results found.  Procedures Procedures (including critical care time)  Medications Ordered in UC Medications - No data to display  Initial Impression / Assessment and Plan / UC Course  I have reviewed the triage vital signs and the nursing notes.  Pertinent labs & imaging results that were available during my care of the patient were reviewed by me and considered in my medical decision making (see chart for details).  Afebrile, well appearing Patient would like chest xray today On preliminary read there is no obvious abnormality, no changes compared to prior reading in 2021. Will await radiology final read.  PCN allergy Cover  with doxycycline BID x 7 for both sinus and lung etiology Can use tessalon TID prn Advised return and ED precautions. Patient agrees  with plan  Final Clinical Impressions(s) / UC Diagnoses   Final diagnoses:  Acute bacterial sinusitis  Productive cough     Discharge Instructions      Your xray looks good. I will call you if the radiologist reads something abnormal.  In the meantime I am treating you with an antibiotic; this will cover for sinus infection as well as lung infection. Take the doxycycline twice daily for 7 days in a row. Always take with food to avoid upset stomach.  The tessalon cough pills can be taken 3x daily. If this medication makes you drowsy, take only one pill before bed.  Please monitor symptoms and return if needed. It might take 2-3 days for the medicine to start working. If symptoms worsen, go to the emergency department.      ED Prescriptions     Medication Sig Dispense Auth. Provider   doxycycline (VIBRAMYCIN) 100 MG capsule Take 1 capsule (100 mg total) by mouth 2 (two) times daily for 7 days. 14 capsule Jerline Linzy, PA-C   benzonatate (TESSALON) 100 MG capsule Take 1 capsule (100 mg total) by mouth 3 (three) times daily as needed for cough. 30 capsule Meagan Spease, Lurena Joiner, PA-C      PDMP not reviewed this encounter.   Marlow Baars, [place] 06/12/23 1807

## 2023-06-12 NOTE — Discharge Instructions (Addendum)
Your xray looks good. I will call you if the radiologist reads something abnormal.  In the meantime I am treating you with an antibiotic; this will cover for sinus infection as well as lung infection. Take the doxycycline twice daily for 7 days in a row. Always take with food to avoid upset stomach.  The tessalon cough pills can be taken 3x daily. If this medication makes you drowsy, take only one pill before bed.  Please monitor symptoms and return if needed. It might take 2-3 days for the medicine to start working. If symptoms worsen, go to the emergency department.

## 2023-06-12 NOTE — ED Triage Notes (Signed)
Pt presents to the office for nasal congestion and cough x 2 weeks. Pt is taking Nyquil

## 2023-06-23 ENCOUNTER — Other Ambulatory Visit: Payer: Self-pay | Admitting: Nurse Practitioner

## 2023-08-27 ENCOUNTER — Other Ambulatory Visit: Payer: Self-pay | Admitting: Nurse Practitioner

## 2023-08-27 DIAGNOSIS — Z1231 Encounter for screening mammogram for malignant neoplasm of breast: Secondary | ICD-10-CM

## 2023-08-28 ENCOUNTER — Other Ambulatory Visit: Payer: Self-pay | Admitting: Nurse Practitioner

## 2023-08-28 MED ORDER — ESCITALOPRAM OXALATE 10 MG PO TABS: 10.0000 mg | ORAL_TABLET | Freq: Every day | ORAL | 0 refills | Status: AC

## 2023-08-28 NOTE — Telephone Encounter (Signed)
 Last Fill: 06/25/23  Last OV: 04/18/23 Next OV: None Scheduled  Routing to provider for review/authorization.

## 2023-08-28 NOTE — Telephone Encounter (Signed)
 Copied from CRM [id number]. Topic: Clinical - Medication Refill >> Aug 28, 2023  2:04 PM Fuller Mandril wrote: Most Recent Primary Care Visit:  Provider: Donell Beers  Department: [facility]  Visit Type: NEW PATIENT  Date: 04/18/2023  Medication: escitalopram (LEXAPRO) 10 MG tablet - patient has not started taking yet thinks the pharmacy no longer has. Will also reach out to pharmacy.  Has the patient contacted their pharmacy? No (Agent: If no, request that the patient contact the pharmacy for the refill. If patient does not wish to contact the pharmacy document the reason why and proceed with request.) (Agent: If yes, when and what did the pharmacy advise?)   Is this the correct pharmacy for this prescription? Yes If no, delete pharmacy and type the correct one.  This is the patient's preferred pharmacy:  [id number] - [place], [place] - [address] AT [facility] [address] [place] [address] Phone: [phone] Fax: [phone]   Has the prescription been filled recently? No  Is the patient out of the medication? N/A - Not yet started   Has the patient been seen for an appointment in the last year OR does the patient have an upcoming appointment? Yes  Can we respond through MyChart? No  Agent: Please be advised that Rx refills may take up to 3 business days. We ask that you follow-up with your pharmacy. >> Aug 28, 2023  2:09 PM Fuller Mandril wrote: [facility] Interpreter ID: [phone]

## 2023-09-05 ENCOUNTER — Ambulatory Visit
Admission: RE | Admit: 2023-09-05 | Discharge: 2023-09-05 | Disposition: A | Discharge status: Home / Self Care | Payer: Medicaid Other | Source: Ambulatory Visit | Attending: Nurse Practitioner | Admitting: Nurse Practitioner

## 2023-09-05 DIAGNOSIS — Z1231 Encounter for screening mammogram for malignant neoplasm of breast: Secondary | ICD-10-CM

## 2023-10-01 ENCOUNTER — Encounter (HOSPITAL_COMMUNITY): Payer: Self-pay

## 2023-10-01 ENCOUNTER — Ambulatory Visit (HOSPITAL_COMMUNITY)
Admission: EM | Admit: 2023-10-01 | Discharge: 2023-10-01 | Disposition: A | Discharge status: Home / Self Care | Attending: Family Medicine | Admitting: Family Medicine

## 2023-10-01 DIAGNOSIS — L239 Allergic contact dermatitis, unspecified cause: Secondary | ICD-10-CM | POA: Diagnosis not present

## 2023-10-01 MED ORDER — DEXAMETHASONE SODIUM PHOSPHATE 10 MG/ML IJ SOLN
INTRAMUSCULAR | Status: AC
  Filled 2023-10-01: qty 1

## 2023-10-01 MED ORDER — DEXAMETHASONE SODIUM PHOSPHATE 10 MG/ML IJ SOLN
10.0000 mg | Freq: Once | INTRAMUSCULAR | Status: AC
  Administered 2023-10-01: 10 mg via INTRAMUSCULAR

## 2023-10-01 MED ORDER — PREDNISONE 20 MG PO TABS: 40.0000 mg | ORAL_TABLET | Freq: Every day | ORAL | 0 refills | Status: AC

## 2023-10-01 NOTE — ED Provider Notes (Signed)
 [facility]    CSN: [id number] Arrival date & time: 10/01/23  1914      History   Chief Complaint No chief complaint on file.   HPI Dana Pope is a 48 y.o. female.   HPI Here for rash and itching all over her body has been going on for at least a week.  No fever and no wheezing or tightness in her chest.  She has had nasal congestion.  She has been taking Zyrtec and using Flonase.  She is allergic to amoxicillin  Last menstrual cycle was about 2 weeks ago Past Medical History:  Diagnosis Date   Allergy    Back pain    Benign positional vertigo 12/21/2010   Depression    Migraine    Vision abnormalities     Patient Active Problem List   Diagnosis Date Noted   Anxiety and depression 04/18/2023   Screening for colon cancer 04/18/2023   Need for influenza vaccination 04/18/2023   Unintentional weight loss 04/18/2023   Allergy    Strain of right trapezius muscle 03/15/2018   Abdominal pain 01/11/2018   Fibrocystic breast changes of both breasts 12/09/2014   Toenail deformity 10/30/2014   DUB (dysfunctional uterine bleeding) 03/05/2014   Decreased libido 03/05/2014   Benign positional vertigo 12/21/2010   PALPITATIONS 08/03/2008   Overweight 01/06/2008   DEPRESSION 01/06/2008   Back pain 01/06/2008   Headache 01/06/2008    Past Surgical History:  Procedure Laterality Date   CESAREAN SECTION     X4   TUBAL LIGATION      OB History     Gravida  4   Para  4   Term  4   Preterm  0   AB  0   Living  4      SAB  0   IAB  0   Ectopic  0   Multiple      Live Births  4            Home Medications    Prior to Admission medications   Medication Sig Start Date End Date Taking? Authorizing Provider  cetirizine (ZYRTEC ALLERGY) 10 MG tablet Take 1 tablet (10 mg total) by mouth daily. 03/26/23  Yes Lamptey, Britta Mccreedy, MD  predniSONE (DELTASONE) 20 MG tablet Take 2 tablets (40 mg total) by mouth daily with breakfast  for 5 days. 10/01/23 10/06/23 Yes Nikhil Osei, Janace Aris, MD  albuterol (VENTOLIN HFA) 108 (90 Base) MCG/ACT inhaler Inhale 1-2 puffs into the lungs every 6 (six) hours as needed for wheezing or shortness of breath. 03/26/23   Merrilee Jansky, MD  escitalopram (LEXAPRO) 10 MG tablet Take 1 tablet (10 mg total) by mouth daily. 08/28/23   Paseda, Baird Kay, FNP  fluticasone (FLONASE) 50 MCG/ACT nasal spray Place 2 sprays into both nostrils daily. 03/26/23   Lamptey, Britta Mccreedy, MD    Family History Family History  Problem Relation Age of Onset   Diabetes Mother    Hypertension Mother    Hypertension Father    Healthy Sister    Healthy Sister    Healthy Sister    Healthy Sister    Healthy Sister    Healthy Sister    Healthy Brother    Healthy Brother    Healthy Brother    Healthy Brother    Cancer Maternal Grandmother        UTERINE   Cancer Paternal Grandmother  SKIN   Stroke Neg Hx    Breast cancer Neg Hx     Social History Social History   Tobacco Use   Smoking status: Never   Smokeless tobacco: Never  Vaping Use   Vaping status: Never Used  Substance Use Topics   Alcohol use: Not Currently    Alcohol/week: 0.0 standard drinks of alcohol   Drug use: No     Allergies   Amoxicillin   Review of Systems Review of Systems   Physical Exam Triage Vital Signs ED Triage Vitals [10/01/23 1942]  Encounter Vitals Group     BP 110/72     Systolic BP Percentile      Diastolic BP Percentile      Pulse Rate 70     Resp 18     Temp 98.2 F (36.8 C)     Temp Source Oral     SpO2 97 %     Weight      Height      Head Circumference      Peak Flow      Pain Score      Pain Loc      Pain Education      Exclude from Growth Chart    No data found.  Updated Vital Signs BP 110/72 (BP Location: Left Arm)   Pulse 70   Temp 98.2 F (36.8 C) (Oral)   Resp 18   LMP 09/15/2023 (Approximate)   SpO2 97%   Visual Acuity Right Eye Distance:   Left Eye Distance:    Bilateral Distance:    Right Eye Near:   Left Eye Near:    Bilateral Near:     Physical Exam Vitals reviewed.  Constitutional:      General: She is not in acute distress.    Appearance: She is not ill-appearing, toxic-appearing or diaphoretic.  HENT:     Mouth/Throat:     Mouth: Mucous membranes are moist.     Pharynx: No oropharyngeal exudate or posterior oropharyngeal erythema.  Eyes:     Extraocular Movements: Extraocular movements intact.     Conjunctiva/sclera: Conjunctivae normal.     Pupils: Pupils are equal, round, and reactive to light.  Cardiovascular:     Rate and Rhythm: Normal rate and regular rhythm.     Heart sounds: No murmur heard. Pulmonary:     Effort: Pulmonary effort is normal. No respiratory distress.     Breath sounds: Normal breath sounds. No stridor. No wheezing, rhonchi or rales.  Skin:    Coloration: Skin is not pale.     Comments: There is a maculopapular erythematous rash on her chest and abdomen and arms.  Neurological:     Mental Status: She is alert and oriented to person, place, and time.  Psychiatric:        Behavior: Behavior normal.      UC Treatments / Results  Labs (all labs ordered are listed, but only abnormal results are displayed) Labs Reviewed - No data to display  EKG   Radiology No results found.  Procedures Procedures (including critical care time)  Medications Ordered in UC Medications  dexamethasone (DECADRON) injection 10 mg (10 mg Intramuscular Given 10/01/23 2011)    Initial Impression / Assessment and Plan / UC Course  I have reviewed the triage vital signs and the nursing notes.  Pertinent labs & imaging results that were available during my care of the patient were reviewed by me and considered in my medical decision  making (see chart for details).     Injection of Decadron is given here and 5-day burst of prednisone is sent to the pharmacy for her allergic dermatitis.  Final Clinical Impressions(s)  / UC Diagnoses   Final diagnoses:  Allergic dermatitis     Discharge Instructions      You have been given a shot of dexamethasone 10 mg, steroid.  Take prednisone 20 mg--2 daily for 5 days      ED Prescriptions     Medication Sig Dispense Auth. Provider   predniSONE (DELTASONE) 20 MG tablet Take 2 tablets (40 mg total) by mouth daily with breakfast for 5 days. 10 tablet Marlinda Mike Janace Aris, MD      PDMP not reviewed this encounter.   Zenia Resides, MD 10/01/23 2013

## 2023-10-01 NOTE — Discharge Instructions (Signed)
 You have been given a shot of dexamethasone 10 mg, steroid.  Take prednisone 20 mg--2 daily for 5 days

## 2023-10-01 NOTE — ED Triage Notes (Signed)
 Patient reports she has hives all over her body x 1 week. She is taking OTC medication with no relief.

## 2024-08-07 ENCOUNTER — Ambulatory Visit: Payer: Self-pay
